# Patient Record
Sex: Male | Born: 1960 | Race: White | Hispanic: No | Smoking: Never smoker
Health system: Southern US, Community
[De-identification: ages and names within clinical notes are randomized; demographics above are authoritative.]

## PROBLEM LIST (undated history)

## (undated) ENCOUNTER — Emergency Department (HOSPITAL_COMMUNITY): Discharge status: Absent without leave | Payer: Self-pay

## (undated) ENCOUNTER — Emergency Department (HOSPITAL_COMMUNITY): Payer: Self-pay | Source: Home / Self Care

## (undated) DIAGNOSIS — I10 Essential (primary) hypertension: Secondary | ICD-10-CM

## (undated) HISTORY — PX: HERNIA REPAIR: SHX51

---

## 1999-02-05 ENCOUNTER — Ambulatory Visit (HOSPITAL_COMMUNITY): Admission: RE | Admit: 1999-02-05 | Discharge: 1999-02-06 | Payer: Self-pay | Admitting: Cardiovascular Disease

## 2014-12-19 ENCOUNTER — Emergency Department
Admission: EM | Admit: 2014-12-19 | Discharge: 2014-12-19 | Disposition: A | Discharge status: Home / Self Care | Payer: BLUE CROSS/BLUE SHIELD | Attending: Emergency Medicine | Admitting: Emergency Medicine

## 2014-12-19 ENCOUNTER — Encounter: Payer: Self-pay | Admitting: Emergency Medicine

## 2014-12-19 ENCOUNTER — Emergency Department: Payer: BLUE CROSS/BLUE SHIELD

## 2014-12-19 DIAGNOSIS — Y9289 Other specified places as the place of occurrence of the external cause: Secondary | ICD-10-CM | POA: Diagnosis not present

## 2014-12-19 DIAGNOSIS — S86811A Strain of other muscle(s) and tendon(s) at lower leg level, right leg, initial encounter: Secondary | ICD-10-CM | POA: Insufficient documentation

## 2014-12-19 DIAGNOSIS — I10 Essential (primary) hypertension: Secondary | ICD-10-CM | POA: Diagnosis not present

## 2014-12-19 DIAGNOSIS — Y9389 Activity, other specified: Secondary | ICD-10-CM | POA: Diagnosis not present

## 2014-12-19 DIAGNOSIS — X501XXA Overexertion from prolonged static or awkward postures, initial encounter: Secondary | ICD-10-CM | POA: Diagnosis not present

## 2014-12-19 DIAGNOSIS — Y998 Other external cause status: Secondary | ICD-10-CM | POA: Insufficient documentation

## 2014-12-19 DIAGNOSIS — Z79899 Other long term (current) drug therapy: Secondary | ICD-10-CM | POA: Insufficient documentation

## 2014-12-19 DIAGNOSIS — S86911A Strain of unspecified muscle(s) and tendon(s) at lower leg level, right leg, initial encounter: Secondary | ICD-10-CM

## 2014-12-19 DIAGNOSIS — Z792 Long term (current) use of antibiotics: Secondary | ICD-10-CM | POA: Diagnosis not present

## 2014-12-19 DIAGNOSIS — S8991XA Unspecified injury of right lower leg, initial encounter: Secondary | ICD-10-CM | POA: Diagnosis present

## 2014-12-19 HISTORY — DX: Essential (primary) hypertension: I10

## 2014-12-19 MED ORDER — HYDROCODONE-ACETAMINOPHEN 5-325 MG PO TABS: 1.0000 | ORAL_TABLET | ORAL | Status: AC | PRN

## 2014-12-19 MED ORDER — MELOXICAM 15 MG PO TABS: 15.0000 mg | ORAL_TABLET | Freq: Every day | ORAL | Status: AC

## 2014-12-19 NOTE — ED Provider Notes (Signed)
[facility] Emergency Department Provider Note  ____________________________________________  Time seen: Approximately 12:39 PM  I have reviewed the triage vital signs and the nursing notes.   HISTORY  Chief Complaint Knee Pain   HPI Aaron Fernandez is a 54 y.o. male presents for evaluation of right knee pain on and off for the past 6 months. 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 Complains of worse pain now than it has been. Currently wearing a knee immobilizer/brace for comfort.   Past Medical History  Diagnosis Date  . Hypertension     There are no active problems to display for this patient.   Past Surgical History  Procedure Laterality Date  . Hernia repair      Current Outpatient Rx  Name  Route  Sig  Dispense  Refill  . AMLODIPINE BESYLATE PO   Oral   Take 10 mg by mouth daily.         . cefdinir (OMNICEF) 300 MG capsule   Oral   Take 300 mg by mouth 2 (two) times daily.         Marland Kitchen triamterene-hydrochlorothiazide (DYAZIDE) 37.5-25 MG capsule   Oral   Take 1 capsule by mouth daily.         Marland Kitchen HYDROcodone-acetaminophen (NORCO) 5-325 MG tablet   Oral   Take 1-2 tablets by mouth every 4 (four) hours as needed for moderate pain.   15 tablet   0   . meloxicam (MOBIC) 15 MG tablet   Oral   Take 1 tablet (15 mg total) by mouth daily.   30 tablet   0     Allergies Review of patient's allergies indicates no known allergies.  History reviewed. No pertinent family history.  Social History Social History  Substance Use Topics  . Smoking status: Never Smoker   . Smokeless tobacco: None  . Alcohol Use: Yes     Comment: rare    Review of Systems Constitutional: No fever/chills Eyes: No visual changes. ENT: No sore throat. Cardiovascular: Denies chest pain. Respiratory: Denies shortness of breath. Gastrointestinal: No  abdominal pain.  No nausea, no vomiting.  No diarrhea.  No constipation. Genitourinary: Negative for dysuria. Musculoskeletal: Positive for right knee pain. Skin: Negative for rash. Neurological: Negative for headaches, focal weakness or numbness.  10-point ROS otherwise negative.  ____________________________________________   PHYSICAL EXAM:  VITAL SIGNS: ED Triage Vitals  Enc Vitals Group     BP 12/19/14 1221 173/110 mmHg     Pulse Rate 12/19/14 1221 93     Resp 12/19/14 1221 20     Temp 12/19/14 1221 98.4 F (36.9 C)     Temp Source 12/19/14 1221 Oral     SpO2 12/19/14 1221 93 %     Weight 12/19/14 1221 305 lb (138.347 kg)     Height 12/19/14 1221 6' (1.829 m)     Head Cir --      Peak Flow --      Pain Score 12/19/14 1221 10     Pain Loc --      Pain Edu? --      Excl. in [place]? --     Constitutional: Alert and oriented. Well appearing and in no acute distress. Cardiovascular: Normal rate, regular rhythm. Grossly normal heart sounds.  Good peripheral circulation. Respiratory: Normal respiratory effort.  No retractions. Lungs CTAB. Gastrointestinal: Soft and nontender. No distention. No abdominal  bruits. No CVA tenderness. Musculoskeletal: No lower extremity tenderness nor edema.  No joint effusions. Neurologic:  Normal speech and language. No gross focal neurologic deficits are appreciated. No gait instability. Skin:  Skin is warm, dry and intact. No rash noted. Psychiatric: Mood and affect are normal. Speech and behavior are normal.  ____________________________________________   LABS (all labs ordered are listed, but only abnormal results are displayed)  Labs Reviewed - No data to display  RADIOLOGY  No acute osseous findings. Negative for fracture dislocation. ____________________________________________   PROCEDURES  Procedure(s) performed: None  Critical Care performed: No  ____________________________________________   INITIAL IMPRESSION /  ASSESSMENT AND PLAN / ED COURSE  Pertinent labs & imaging results that were available during my care of the patient were reviewed by me and considered in my medical decision making (see chart for details).  Acute right knee strain. Encouraged orthopedic follow-up. Continue her knee immobilizer as needed. Rx given for meloxicam 50 mg daily hydrocodone 5/325 for pain. Continue to monitor blood pressure and follow up with PCP clinic for evaluation and possible adjustment of blood pressure medication.  Patient voices no other emergency medical complaints at this time.1 ____________________________________________   FINAL CLINICAL IMPRESSION(S) / ED DIAGNOSES  Final diagnoses:  Knee strain, right, initial encounter      Arlyss Repress, PA-C 12/19/14 [place], MD 12/19/14 1538

## 2014-12-19 NOTE — ED Notes (Signed)
C/o right knee pain for 6 months, has xray scheduled but pain has been worse.  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

## 2014-12-19 NOTE — Discharge Instructions (Signed)

## 2015-01-02 ENCOUNTER — Other Ambulatory Visit: Payer: Self-pay | Admitting: Orthopedic Surgery

## 2015-01-02 DIAGNOSIS — M25561 Pain in right knee: Secondary | ICD-10-CM

## 2015-01-02 DIAGNOSIS — T1590XA Foreign body on external eye, part unspecified, unspecified eye, initial encounter: Secondary | ICD-10-CM

## 2015-01-02 DIAGNOSIS — Z9889 Other specified postprocedural states: Secondary | ICD-10-CM

## 2015-01-02 DIAGNOSIS — Z77018 Contact with and (suspected) exposure to other hazardous metals: Secondary | ICD-10-CM

## 2015-01-24 ENCOUNTER — Ambulatory Visit
Admission: RE | Admit: 2015-01-24 | Discharge: 2015-01-24 | Disposition: A | Discharge status: Home / Self Care | Payer: BLUE CROSS/BLUE SHIELD | Source: Ambulatory Visit | Attending: Orthopedic Surgery | Admitting: Orthopedic Surgery

## 2015-01-24 DIAGNOSIS — S83206A Unspecified tear of unspecified meniscus, current injury, right knee, initial encounter: Secondary | ICD-10-CM | POA: Insufficient documentation

## 2015-01-24 DIAGNOSIS — Z77018 Contact with and (suspected) exposure to other hazardous metals: Secondary | ICD-10-CM

## 2015-01-24 DIAGNOSIS — M25561 Pain in right knee: Secondary | ICD-10-CM | POA: Insufficient documentation

## 2015-01-24 DIAGNOSIS — T1590XA Foreign body on external eye, part unspecified, unspecified eye, initial encounter: Secondary | ICD-10-CM

## 2017-02-24 DIAGNOSIS — C4491 Basal cell carcinoma of skin, unspecified: Secondary | ICD-10-CM

## 2017-02-24 HISTORY — DX: Basal cell carcinoma of skin, unspecified: C44.91

## 2017-02-28 IMAGING — CR DG ORBITS FOR FOREIGN BODY
1 series · 2 of 2 positions shown · non-contrast
Comparison: None.

CLINICAL DATA: Metal working/exposure; clearance prior to MRI

EXAM:
ORBITS FOR FOREIGN BODY - 2 VIEW

[Series 1: dg eye foreign body · 0.14mm/px · 2 of 2 slices shown]
[im 1/2]
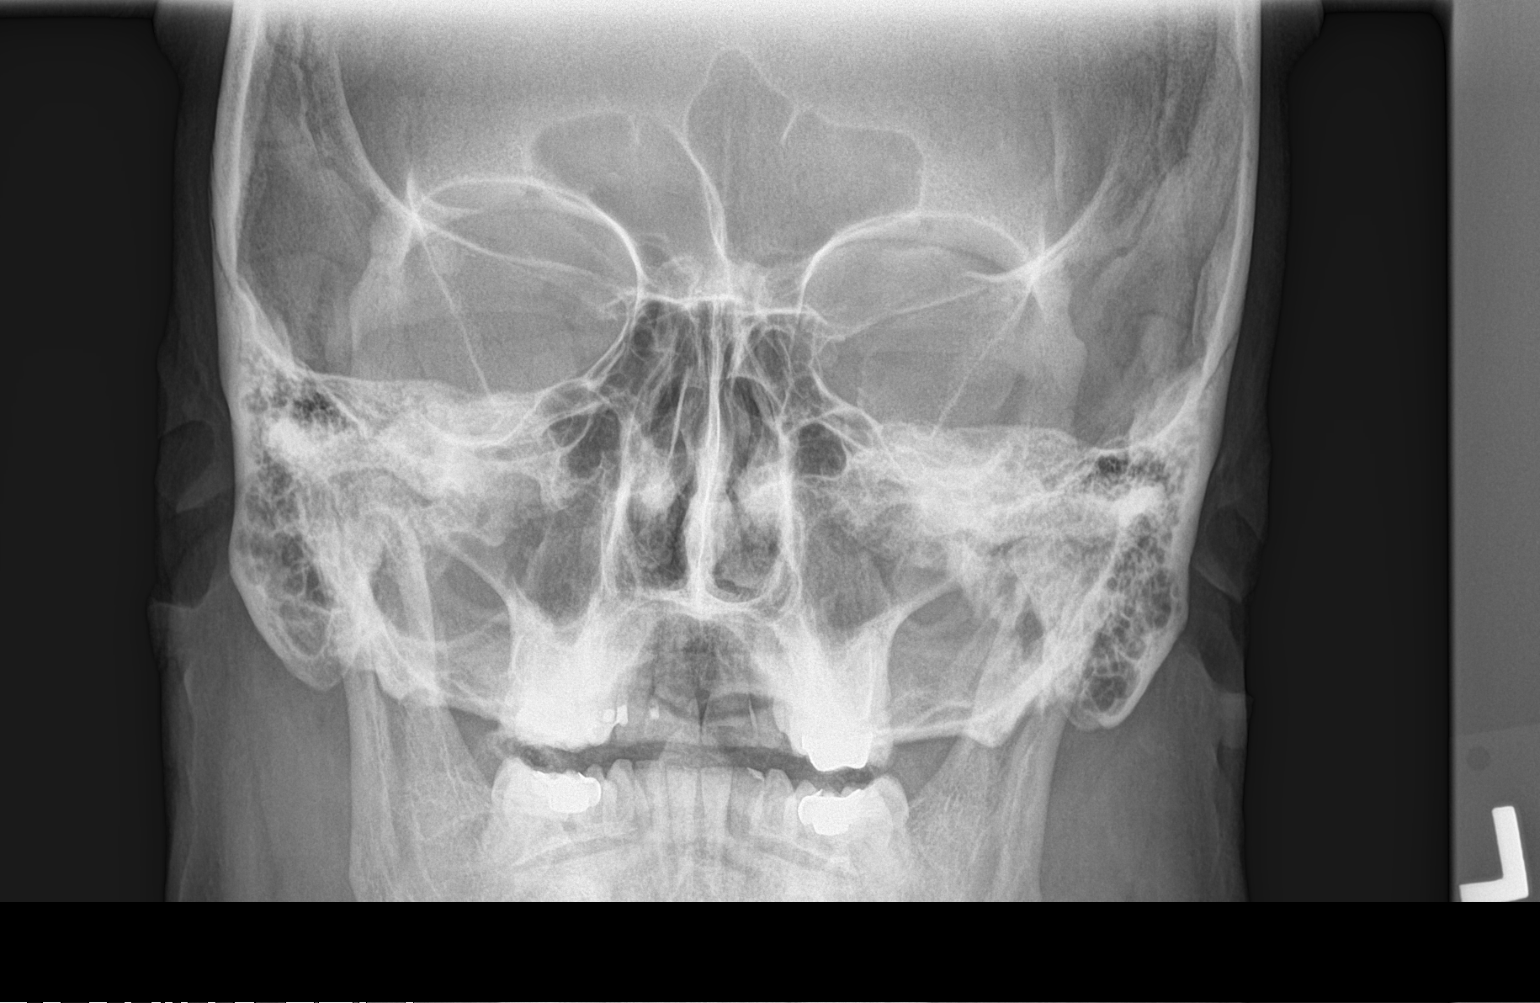
[im 2/2]
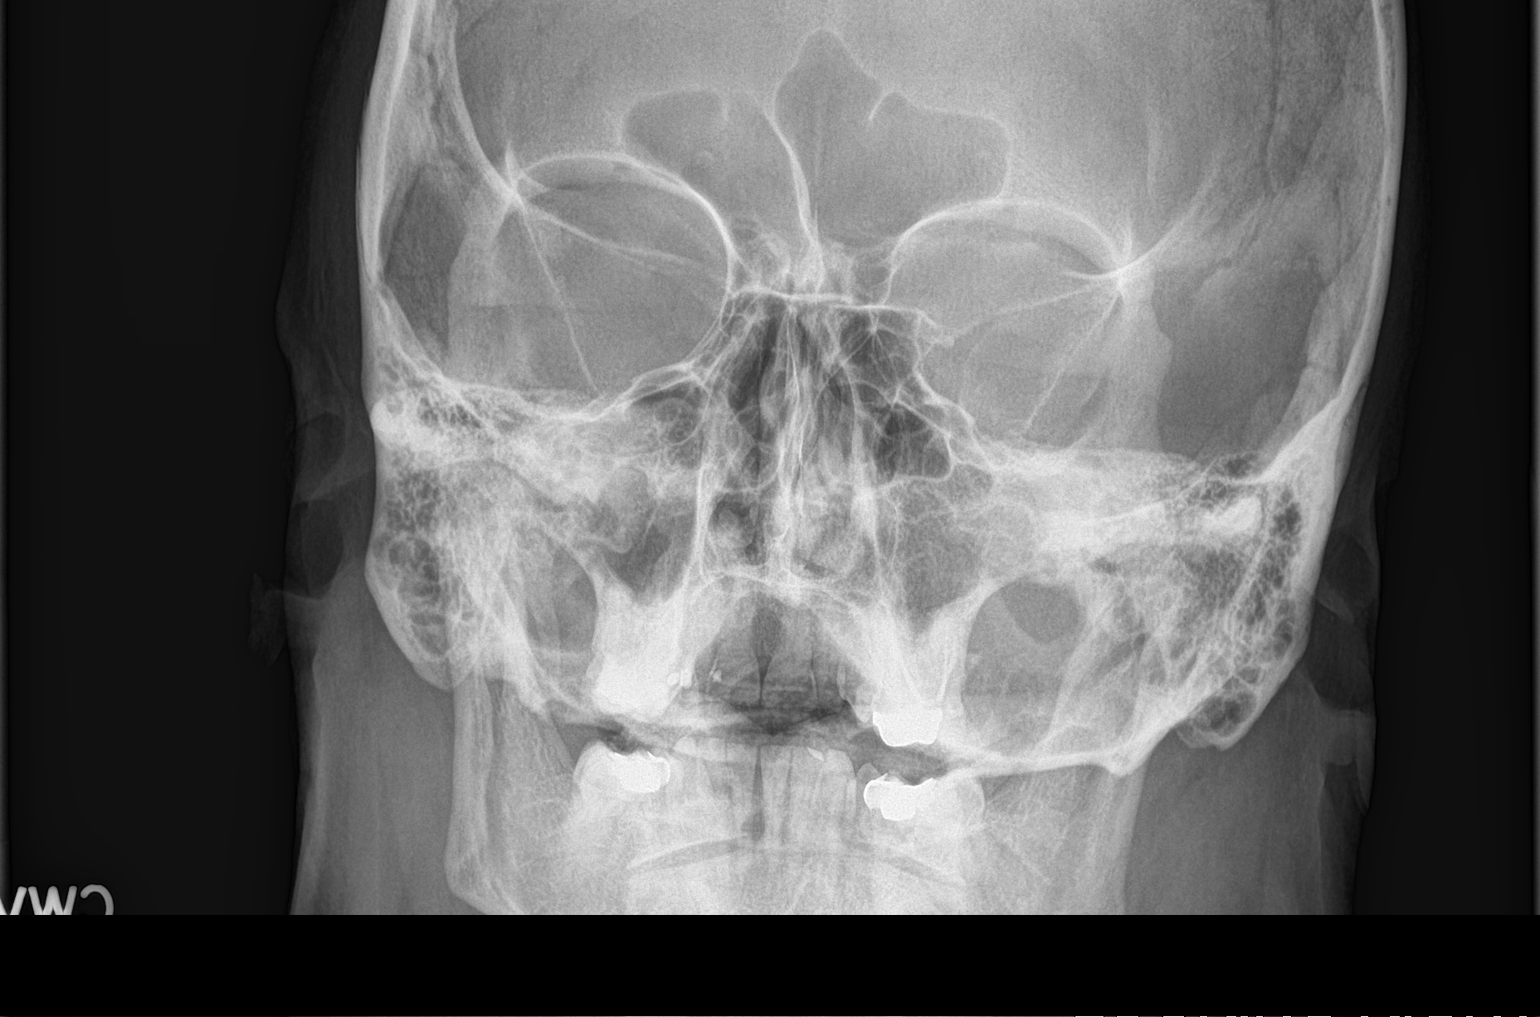

[2 of 2 positions shown; findings below may reference images not displayed]

FINDINGS: There is no evidence of metallic foreign body within the orbits. No
significant bone abnormality identified.
IMPRESSION: No evidence of metallic foreign body within the orbits.

## 2017-02-28 IMAGING — MR MR KNEE*R* W/O CM
5 series · 39 of 40 positions shown · non-contrast
Comparison: None.

CLINICAL DATA: Lifting injury right knee in June 2014. Medial left
knee pain since the incident. Subsequent encounter.

EXAM:
MRI OF THE RIGHT KNEE WITHOUT CONTRAST
TECHNIQUE: Multiplanar, multisequence MR imaging of the knee was performed. No
intravenous contrast was administered.

[Series 3: PD fat-sat · axial · 3.0mm · 0.33mm/px · z∈[-59,+57]mm · 8 of 36 slices shown (1 of 3)]
[im 1/36]
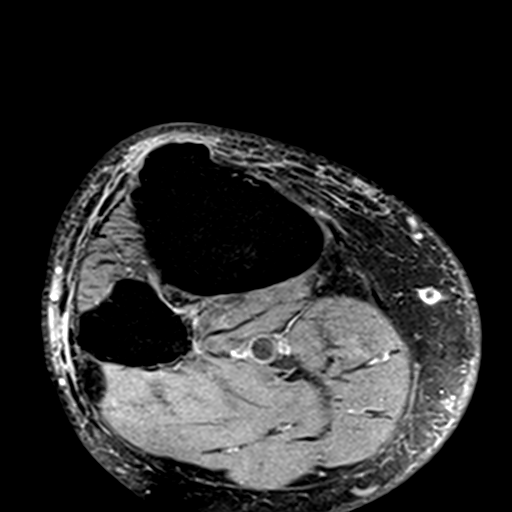
[im 6/36]
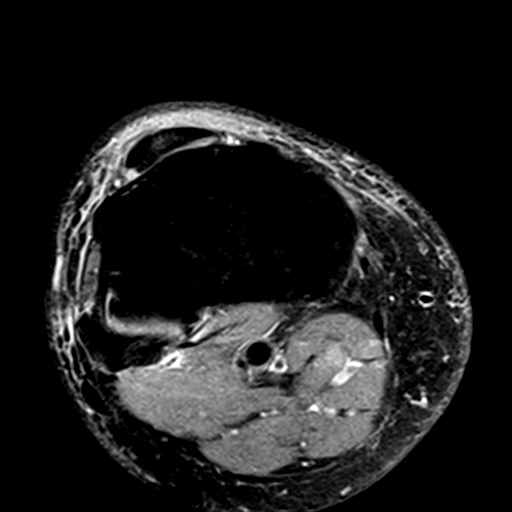
[im 11/36]
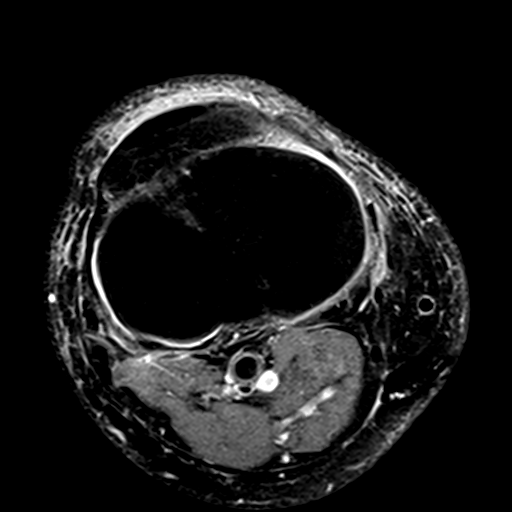
[im 16/36]
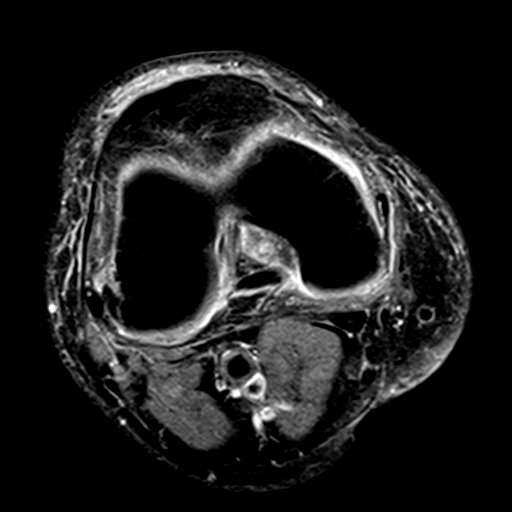
[im 21/36]
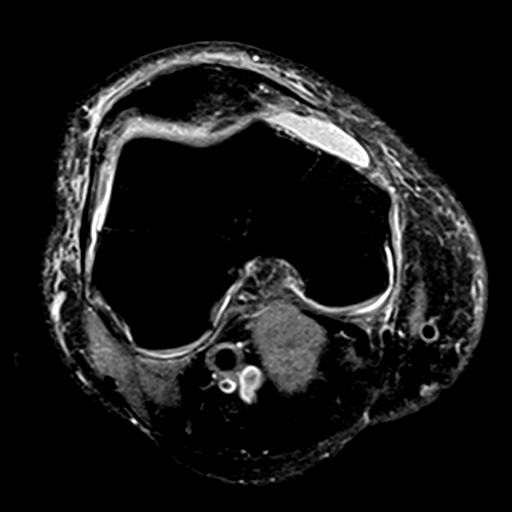
[im 26/36]
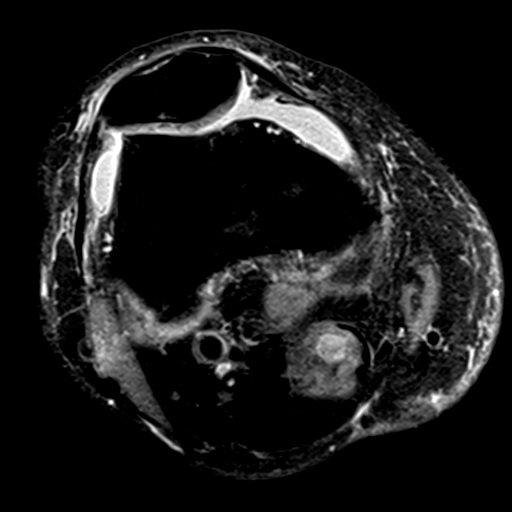
[im 31/36]
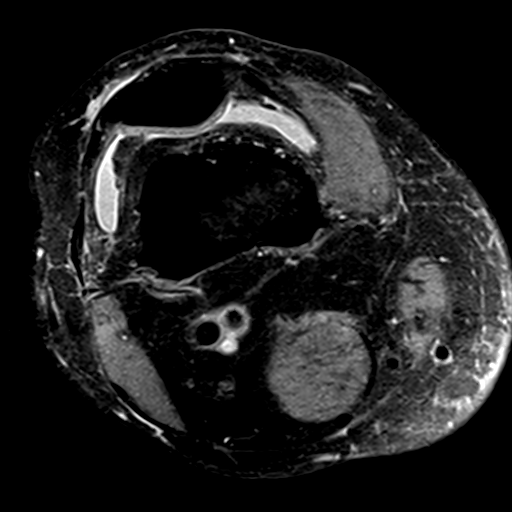
[im 36/36]
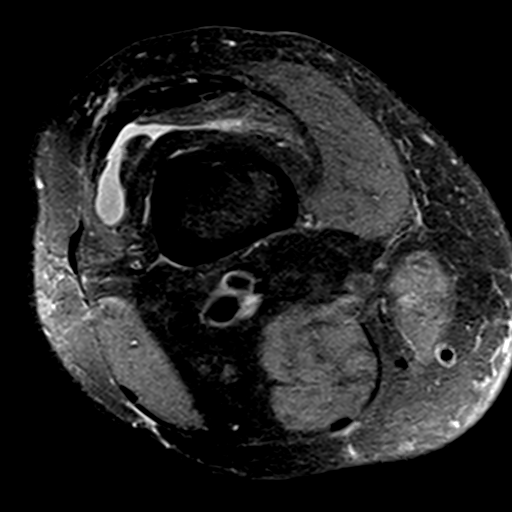

[Series 4: T1 · coronal · 3.0mm · 0.50mm/px · 7 of 35 slices shown]
[im 1/35]
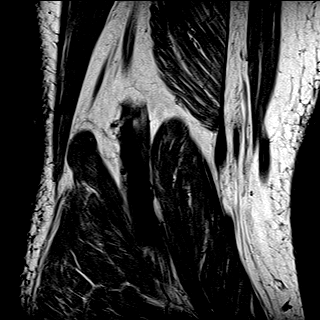
[im 5/35]
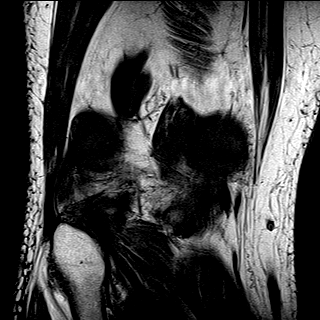
[im 10/35]
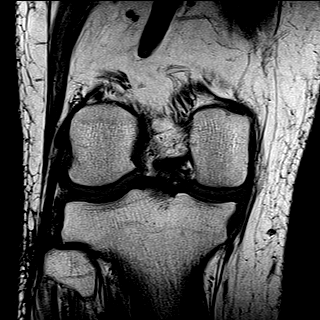
[im 15/35]
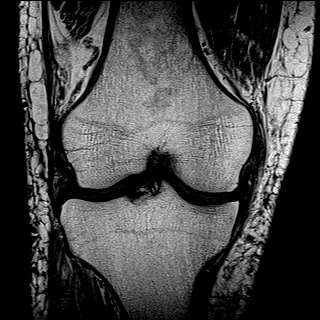
[im 20/35]
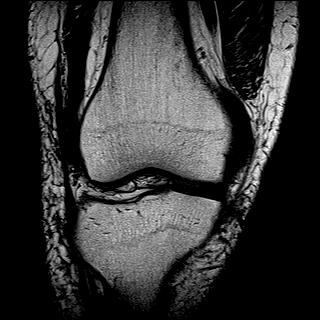
[im 25/35]
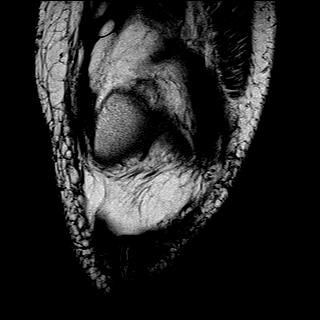
[im 30/35]
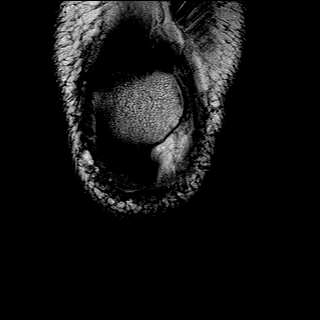

[Series 5: T2 fat-sat · coronal · 3.0mm · 0.50mm/px · 8 of 35 slices shown]
[im 1/35]
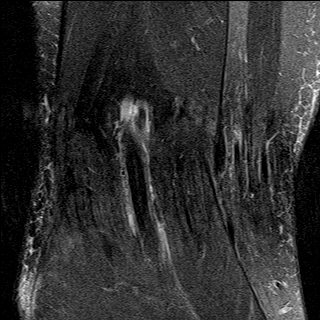
[im 5/35]
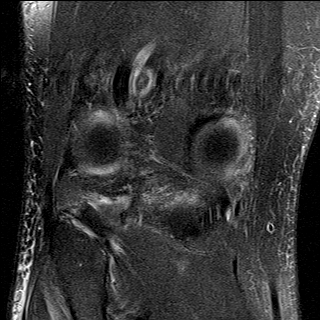
[im 10/35]
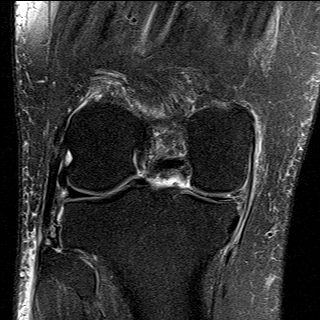
[im 15/35]
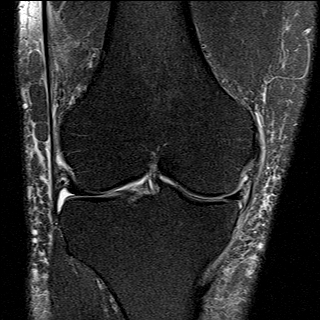
[im 20/35]
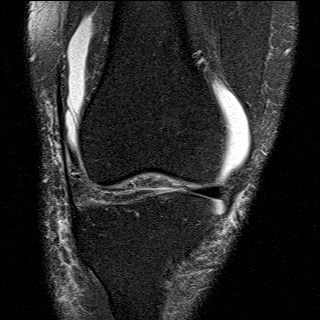
[im 25/35]
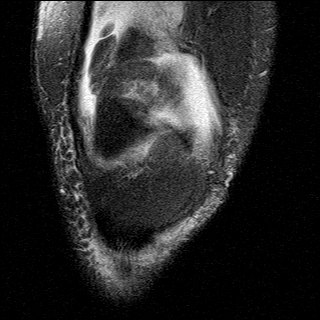
[im 30/35]
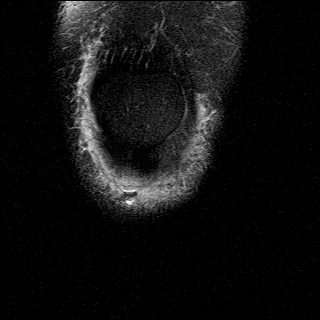
[im 35/35]
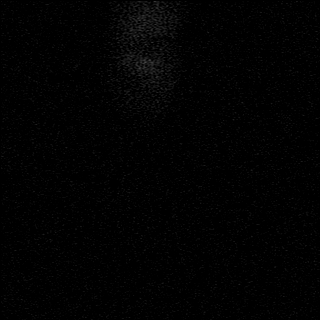

[Series 6: PD fat-sat · sagittal · 3.0mm · 0.66mm/px · 8 of 38 slices shown (2 of 3)]
[im 1/38]
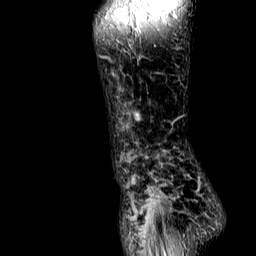
[im 6/38]
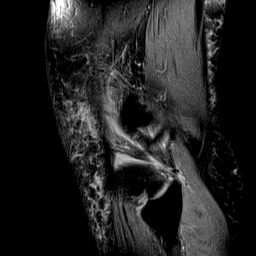
[im 11/38]
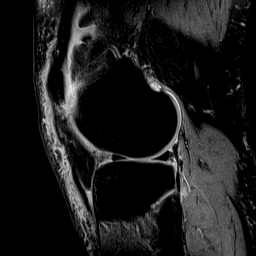
[im 16/38]
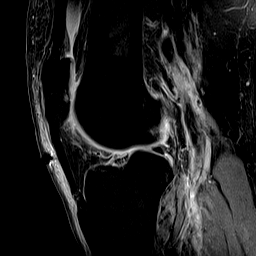
[im 22/38]
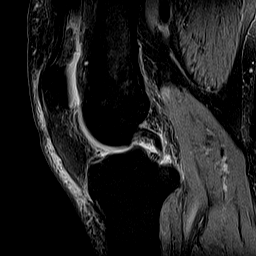
[im 27/38]
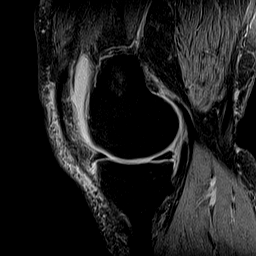
[im 32/38]
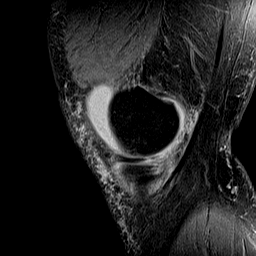
[im 38/38]
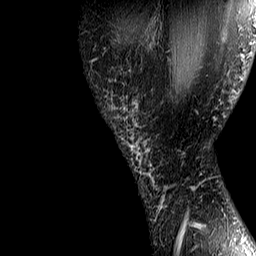

[Series 7: PD fat-sat · coronal · 3.0mm · 0.62mm/px · 8 of 35 slices shown (3 of 3)]
[im 1/35]
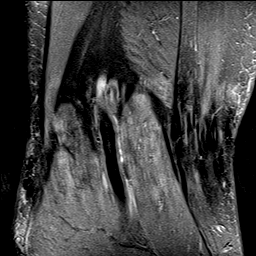
[im 5/35]
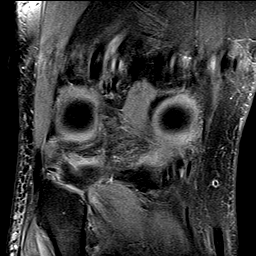
[im 10/35]
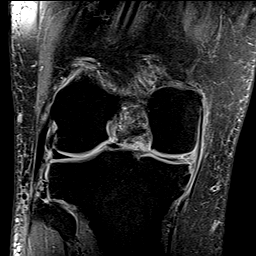
[im 15/35]
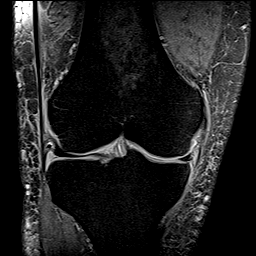
[im 20/35]
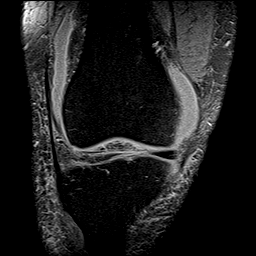
[im 25/35]
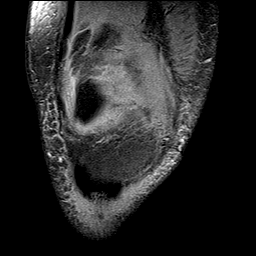
[im 30/35]
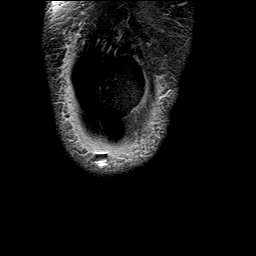
[im 35/35]
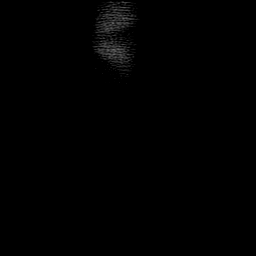

[39 of 40 positions shown; findings below may reference images not displayed]

FINDINGS: MENISCI

Medial meniscus: There is a large horizontal tear reaching the
meniscal undersurface in the periphery of the posterior horn of the
medial meniscus extending to the mid meniscal body. At the junction
the posterior horn and body, the tear has a complex configuration
with both longitudinal and horizontal components. No displaced
fragment.

Lateral meniscus:  Intact.

LIGAMENTS

Cruciates:  Intact.

Collaterals:  Intact.

CARTILAGE

Patellofemoral: Hyaline cartilage loss is most notable along the
lateral patellar facet where tiny subchondral cysts are identified.

Medial:  Unremarkable.

Lateral:  Unremarkable.

Joint:  Small to moderate joint effusion.

Popliteal Fossa:  No Baker's cyst.

Extensor Mechanism:  Intact.

Bones:  Normal marrow signal throughout.
IMPRESSION: Large tear periphery of the posterior horn and posterior aspect of
the body of the medial meniscus without displaced fragment as
described above.

Mild patellofemoral degenerative disease.

## 2019-08-11 ENCOUNTER — Other Ambulatory Visit: Payer: Self-pay

## 2019-08-11 ENCOUNTER — Encounter: Payer: Self-pay | Admitting: Dermatology

## 2019-08-11 ENCOUNTER — Ambulatory Visit (INDEPENDENT_AMBULATORY_CARE_PROVIDER_SITE_OTHER): Payer: BLUE CROSS/BLUE SHIELD | Admitting: Dermatology

## 2019-08-11 DIAGNOSIS — L409 Psoriasis, unspecified: Secondary | ICD-10-CM

## 2019-08-11 DIAGNOSIS — D229 Melanocytic nevi, unspecified: Secondary | ICD-10-CM

## 2019-08-11 DIAGNOSIS — I872 Venous insufficiency (chronic) (peripheral): Secondary | ICD-10-CM

## 2019-08-11 DIAGNOSIS — L304 Erythema intertrigo: Secondary | ICD-10-CM

## 2019-08-11 DIAGNOSIS — L570 Actinic keratosis: Secondary | ICD-10-CM

## 2019-08-11 DIAGNOSIS — D1800 Hemangioma unspecified site: Secondary | ICD-10-CM

## 2019-08-11 DIAGNOSIS — L820 Inflamed seborrheic keratosis: Secondary | ICD-10-CM

## 2019-08-11 DIAGNOSIS — L408 Other psoriasis: Secondary | ICD-10-CM

## 2019-08-11 DIAGNOSIS — L814 Other melanin hyperpigmentation: Secondary | ICD-10-CM

## 2019-08-11 DIAGNOSIS — L578 Other skin changes due to chronic exposure to nonionizing radiation: Secondary | ICD-10-CM

## 2019-08-11 DIAGNOSIS — L817 Pigmented purpuric dermatosis: Secondary | ICD-10-CM

## 2019-08-11 DIAGNOSIS — Z1283 Encounter for screening for malignant neoplasm of skin: Secondary | ICD-10-CM

## 2019-08-11 NOTE — Progress Notes (Signed)
Follow-Up Visit   Subjective  Aaron Fernandez is a 59 y.o. male who presents for the following: TBSE (Patient has a few areas of concern today). The patient presents for Total-Body Skin Exam (TBSE) for skin cancer screening and mole check. Patient presents today for  Annual TBSE, does have a few areas of concern under his breasts, scalp and low back. Patient has a h/o BCC on L. chest and shoulder in 2019. Patient has a family h/o of psoriasis  The following portions of the chart were reviewed this encounter and updated as appropriate:  Allergies  Meds  Problems  Med Hx  Surg Hx  Fam Hx      Review of Systems:  No other skin or systemic complaints except as noted in HPI or Assessment and Plan.  Objective  Well appearing patient in no apparent distress; mood and affect are within normal limits.  A full examination was performed including scalp, head, eyes, ears, nose, lips, neck, chest, axillae, abdomen, back, buttocks, bilateral upper extremities, bilateral lower extremities, hands, feet, fingers, toes, fingernails, and toenails. All findings within normal limits unless otherwise noted below.  Objective  Trunk, extremities: Almost confluent plaques on trunk and extremities    Objective  Chest and Groin: Xerotic hyperpigmentation   Objective  Left Forearm - Anterior, Right Malar Cheek: Erythematous thin papules/macules with gritty scale.   Objective  Bilateral lower legs: irregular patches of orange or brown pigmentation with characteristic "cayenne pepper" spots appearing  Objective  Bilateral lower legs: brown or red skin discoloration around your ankles and lower legs  Objective  Back (14): Erythematous keratotic or waxy stuck-on papule or plaque.    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 Avoid f/g/a Continue mometasone as needed for face Discussed [place] [facility]  and will consider at f/u in 3 months.  Patient would benefit from systemic treatment, but he does not have insurance and systemic treatment may be unaffordable for him at this time.  We may be able to get free medication for him but we would like to get control of his precancerous actinic keratoses before we start more aggressive psoriasis treatment. BSA 40%  Erythema intertrigo Chest and Groin With psoriasis inversa Start Intertrigo cream from [facility]: Iodoquinol: 1% Hydrocortisone: 2.5%, Niacinamide: 2%, Cream  AK (actinic keratosis) (2) Left Forearm - Anterior; Right Malar Cheek Start 5-fluorouracil 5%, and Calcipotriene cream 0.005% mix from [facility]  cream apply twice daily for 10 days to both forearms, top of ears and both temples.  Schamberg's purpura Bilateral lower legs The patient will observe these symptoms, and report promptly any worsening or unexpected persistence.  If well, may return prn.  Venous stasis dermatitis of both lower extremities Bilateral lower legs Recommend graduated compression stockings. The patient will observe these symptoms, and report promptly any worsening or unexpected persistence.  If well, may return prn.  Inflamed seborrheic keratosis (14) Back Cryotherapy today Prior to procedure, discussed risks of blister formation, small wound, skin dyspigmentation, or rare scar following cryotherapy.   Destruction of lesion - Back Complexity: simple   Destruction method: cryotherapy   Informed consent: discussed and consent obtained   Timeout:  patient name, date of birth, surgical site, and procedure verified Lesion destroyed using liquid nitrogen: Yes   Region frozen until ice ball extended beyond lesion: Yes   Outcome: patient tolerated procedure well with no complications   Post-procedure details: wound  care instructions given     Lentigines - Scattered tan macules - Discussed due to sun exposure - Benign, observe - Call for any  changes  Seborrheic Keratoses - Stuck-on, waxy, tan-brown papules and plaques  - Discussed benign etiology and prognosis. - Observe - Call for any changes  Melanocytic Nevi - Tan-brown and/or pink-flesh-colored symmetric macules and papules - Benign appearing on exam today - Observation - Call clinic for new or changing moles - Recommend daily use of broad spectrum spf 30+ sunscreen to sun-exposed areas.   Hemangiomas - Red papules - Discussed benign nature - Observe - Call for any changes  Actinic Damage - diffuse scaly erythematous macules with underlying dyspigmentation - Recommend daily broad spectrum sunscreen SPF 30+ to sun-exposed areas, reapply every 2 hours as needed.  - Call for new or changing lesions.  Skin cancer screening performed today.  Return in about 3 months (around 11/11/2019) for AK Follow up.  Marene Lenz, CMA, am acting as scribe for Sarina Ser, MD . Documentation: I have reviewed the above documentation for accuracy and completeness, and I agree with the above.  Sarina Ser, MD

## 2019-08-11 NOTE — Patient Instructions (Addendum)
Recommend daily broad spectrum sunscreen SPF 30+ to sun-exposed areas, reapply every 2 hours as needed. Call for new or changing lesions.  Prior to procedure, discussed risks of blister formation, small wound, skin dyspigmentation, or rare scar following cryotherapy.  Liquid nitrogen was applied for 10-12 seconds to the skin lesion and the expected blistering or scabbing reaction explained. Do not pick at the area. Patient reminded to expect hypopigmented scars from the procedure. Return if lesion fails to fully resolve.  Cryotherapy Aftercare  . Wash gently with soap and water everyday.   Marland Kitchen Apply Vaseline and Band-Aid daily until healed.    5  F/U cream Apply twice daily for 10 days to tops of both ears, left and right forearms, and left and right temples  Intertrigo cream (for rash) Apply twice daily to affected areas on chest and groin

## 2019-08-14 ENCOUNTER — Encounter: Payer: Self-pay | Admitting: Dermatology

## 2019-10-04 ENCOUNTER — Telehealth: Payer: Self-pay | Admitting: Family

## 2019-10-04 NOTE — Telephone Encounter (Signed)
He has been scheduled for infusion at [facility]. Appreciative of call. Will remove from our infusion clinic list.   Loel Dubonnet, NP

## 2019-10-04 NOTE — Telephone Encounter (Signed)
Called to Discuss with patient about Covid symptoms and the use of the monoclonal antibody infusion for those with mild to moderate Covid symptoms and at a high risk of hospitalization.     Returned call from infusion hotline. Pt appears to qualify for this infusion due to co-morbid conditions (HTN) and/or a member of an at-risk group in accordance with the FDA Emergency Use Authorization.    Unable to reach patient.   Loel Dubonnet, NP

## 2019-11-23 ENCOUNTER — Ambulatory Visit: Payer: Self-pay | Admitting: Dermatology

## 2019-12-28 ENCOUNTER — Other Ambulatory Visit: Payer: Self-pay

## 2019-12-28 ENCOUNTER — Encounter: Payer: Self-pay | Admitting: Emergency Medicine

## 2019-12-28 ENCOUNTER — Emergency Department
Admission: EM | Admit: 2019-12-28 | Discharge: 2019-12-28 | Disposition: A | Discharge status: Home / Self Care | Payer: Self-pay | Attending: Emergency Medicine | Admitting: Emergency Medicine

## 2019-12-28 DIAGNOSIS — Z2914 Encounter for prophylactic rabies immune globin: Secondary | ICD-10-CM | POA: Insufficient documentation

## 2019-12-28 DIAGNOSIS — Z23 Encounter for immunization: Secondary | ICD-10-CM | POA: Insufficient documentation

## 2019-12-28 DIAGNOSIS — Z79899 Other long term (current) drug therapy: Secondary | ICD-10-CM | POA: Insufficient documentation

## 2019-12-28 DIAGNOSIS — Z203 Contact with and (suspected) exposure to rabies: Secondary | ICD-10-CM | POA: Insufficient documentation

## 2019-12-28 DIAGNOSIS — I10 Essential (primary) hypertension: Secondary | ICD-10-CM | POA: Insufficient documentation

## 2019-12-28 MED ORDER — RABIES VACCINE, PCEC IM SUSR
1.0000 mL | Freq: Once | INTRAMUSCULAR | Status: AC
  Administered 2019-12-28: 1 mL via INTRAMUSCULAR
  Filled 2019-12-28: qty 1

## 2019-12-28 MED ORDER — RABIES IMMUNE GLOBULIN 150 UNIT/ML IM INJ
20.0000 [IU]/kg | INJECTION | Freq: Once | INTRAMUSCULAR | Status: AC
  Administered 2019-12-28: 2925 [IU] via INTRAMUSCULAR
  Filled 2019-12-28: qty 19.5

## 2019-12-28 NOTE — Discharge Instructions (Addendum)
Advised to follow-up on day 3, day 7, day 14 to complete series.  Rabies series can be completed at the [facility] urgent care clinic instead of coming to the emergency room.

## 2019-12-28 NOTE — ED Triage Notes (Signed)
Pt to ED via POV with c/o rabies exposure. Pt states was handing a cat who ended up testing positive for rabies. Pt denies known injury from cat at this time. Pt states last exposure to cat was Saturday.

## 2019-12-28 NOTE — ED Provider Notes (Signed)
[name] Provider Note   ____________________________________________   First MD Initiated Contact with Patient 12/28/19 1436     (approximate)  I have reviewed the triage vital signs and the nursing notes.   HISTORY  Chief Complaint Rabies Injection    HPI Aaron Fernandez is a 59 y.o. male patient presents with rib exposure.  Patient that he was around a cat and tested positive for rabies.  Patient states last exposure to the cat was 4 days ago.  Patient denies injury but was told by his doctor's office they need to consider taking the rabies injection.  Patient tetanus shot is up-to-date.         Past Medical History:  Diagnosis Date  . Basal cell carcinoma 02/24/2017   left ant shoulder  . Basal cell carcinoma 03/11/2017   left chest parasternal  . Hypertension     There are no problems to display for this patient.   Past Surgical History:  Procedure Laterality Date  . HERNIA REPAIR      Prior to Admission medications   Medication Sig Start Date End Date Taking? Authorizing Provider  AMLODIPINE BESYLATE PO Take 10 mg by mouth daily.    [provider]  cefdinir (OMNICEF) 300 MG capsule Take 300 mg by mouth 2 (two) times daily.    [provider]  HYDROcodone-acetaminophen (NORCO) 5-325 MG tablet Take 1-2 tablets by mouth every 4 (four) hours as needed for moderate pain. 12/19/14   Beers, Pierce Crane, PA-C  meloxicam (MOBIC) 15 MG tablet Take 1 tablet (15 mg total) by mouth daily. 12/19/14   Beers, Pierce Crane, PA-C  mometasone (ELOCON) 0.1 % cream Apply 1 application topically daily.    [provider]  triamcinolone cream (KENALOG) 0.1 % Apply 1 application topically 2 (two) times daily.    [provider]  triamterene-hydrochlorothiazide (DYAZIDE) 37.5-25 MG capsule Take 1 capsule by mouth daily.    [provider]    Allergies Patient has no known  allergies.  History reviewed. No pertinent family history.  Social History Social History   Tobacco Use  . Smoking status: Never Smoker  Substance Use Topics  . Alcohol use: Yes    Comment: rare  . Drug use: Not on file    Review of Systems Constitutional: No fever/chills Eyes: No visual changes. ENT: No sore throat. Cardiovascular: Denies chest pain. Respiratory: Denies shortness of breath. Gastrointestinal: No abdominal pain.  No nausea, no vomiting.  No diarrhea.  No constipation. Genitourinary: Negative for dysuria. Musculoskeletal: Negative for back pain. Skin: Negative for rash. Neurological: Negative for headaches, focal weakness or numbness. Endocrine:  Hypertension   ____________________________________________   PHYSICAL EXAM:  VITAL SIGNS: ED Triage Vitals  Enc Vitals Group     BP 12/28/19 1347 (!) 159/82     Pulse Rate 12/28/19 1347 89     Resp 12/28/19 1347 20     Temp 12/28/19 1347 98.3 F (36.8 C)     Temp Source 12/28/19 1347 Oral     SpO2 12/28/19 1347 96 %     Weight 12/28/19 1348 (!) 325 lb (147.4 kg)     Height 12/28/19 1348 6' (1.829 m)     Head Circumference --      Peak Flow --      Pain Score 12/28/19 1347 0     Pain Loc --      Pain Edu? --      Excl. in [place]? --  Constitutional: Alert and oriented. Well appearing and in no acute distress. Cardiovascular: Normal rate, regular rhythm. Grossly normal heart sounds.  Good peripheral circulation.  Elevated blood pressure. Respiratory: Normal respiratory effort.  No retractions. Lungs CTAB. Neurologic:  Normal speech and language. No gross focal neurologic deficits are appreciated. No gait instability. Skin:  Skin is warm, dry and intact. No rash noted.  No abrasion or ecchymosis Psychiatric: Mood and affect are normal. Speech and behavior are normal.  ____________________________________________   LABS (all labs ordered are listed, but only abnormal results are displayed)  Labs  Reviewed - No data to display ____________________________________________  EKG   ____________________________________________  RADIOLOGY I, Sable Feil, personally viewed and evaluated these images (plain radiographs) as part of my medical decision making, as well as reviewing the written report by the radiologist.  ED MD interpretation:    Official radiology report(s): No results found.  ____________________________________________   PROCEDURES  Procedure(s) performed (including Critical Care):  Procedures   ____________________________________________   INITIAL IMPRESSION / ASSESSMENT AND PLAN / ED COURSE  As part of my medical decision making, I reviewed the following data within the [place]         Patient presents with close exposure to rabies x4 days ago.  Patient requests rabies shots.  Patient given discharge care instructions advised to follow schedule to complete rabies series.      ____________________________________________   FINAL CLINICAL IMPRESSION(S) / ED DIAGNOSES  Final diagnoses:  Contact with and exposure to rabies     ED Discharge Orders    None      *Please note:  Aaron Fernandez was evaluated in Emergency Department on 12/28/2019 for the symptoms described in the history of present illness. He was evaluated in the context of the global COVID-19 pandemic, which necessitated consideration that the patient might be at risk for infection with the SARS-CoV-2 virus that causes COVID-19. Institutional protocols and algorithms that pertain to the evaluation of patients at risk for COVID-19 are in a state of rapid change based on information released by regulatory bodies including the CDC and federal and state organizations. These policies and algorithms were followed during the patient's care in the ED.  Some ED evaluations and interventions may be delayed as a result of limited staffing during and the  pandemic.*   Note:  This document was prepared using Dragon voice recognition software and may include unintentional dictation errors.    Sable Feil, PA-C 12/28/19 1453    Naaman Plummer, MD 12/28/19 [phone]

## 2019-12-31 ENCOUNTER — Encounter: Payer: Self-pay | Admitting: Emergency Medicine

## 2019-12-31 ENCOUNTER — Ambulatory Visit
Admission: EM | Admit: 2019-12-31 | Discharge: 2019-12-31 | Disposition: A | Discharge status: Home / Self Care | Payer: PRIVATE HEALTH INSURANCE | Attending: Physician Assistant | Admitting: Physician Assistant

## 2019-12-31 DIAGNOSIS — Z203 Contact with and (suspected) exposure to rabies: Secondary | ICD-10-CM

## 2019-12-31 MED ORDER — RABIES VACCINE, PCEC IM SUSR
1.0000 mL | Freq: Once | INTRAMUSCULAR | Status: AC
  Administered 2019-12-31: 1 mL via INTRAMUSCULAR

## 2019-12-31 NOTE — ED Triage Notes (Addendum)
Pt presents to [facility] for day 3 rabies vaccine. Pt states he did well with the other vaccines.

## 2020-01-04 ENCOUNTER — Other Ambulatory Visit: Payer: Self-pay

## 2020-01-04 ENCOUNTER — Ambulatory Visit
Admission: EM | Admit: 2020-01-04 | Discharge: 2020-01-04 | Disposition: A | Discharge status: Home / Self Care | Payer: PRIVATE HEALTH INSURANCE | Attending: Family Medicine | Admitting: Family Medicine

## 2020-01-04 DIAGNOSIS — Z203 Contact with and (suspected) exposure to rabies: Secondary | ICD-10-CM

## 2020-01-04 DIAGNOSIS — Z23 Encounter for immunization: Secondary | ICD-10-CM

## 2020-01-04 MED ORDER — RABIES VACCINE, PCEC IM SUSR
1.0000 mL | Freq: Once | INTRAMUSCULAR | Status: AC
  Administered 2020-01-04: 1 mL via INTRAMUSCULAR

## 2020-01-04 NOTE — ED Triage Notes (Signed)
Patient is here for Day 7 of his rabies vaccine series.  Patient states he has had no other problems with his injections. Injection given in right deltoid.

## 2020-01-04 NOTE — Discharge Instructions (Signed)
Rabies Vaccine: What You Need to Know 1. Why get vaccinated? Rabies vaccine can prevent rabies. Rabies is mainly a disease of animals. Humans get rabies when they are bitten or scratched by infected animals.  Human rabies is rare in the [place]. Wild animals like bats, raccoons, skunks, and foxes are the most common source of human rabies infection in the [place].  Rabies is more common in other parts of the world where dogs still carry rabies. Most rabies deaths in people around the world are caused by bites from unvaccinated dogs. Rabies infects the central nervous system. After infection with rabies, at first there might not be any symptoms. Weeks or even months after a bite, rabies can cause general weakness or discomfort, fever, or headache. As the disease progresses, the person may experience delirium, abnormal behavior, hallucinations, hydrophobia (fear of water), and insomnia. If a person does not receive appropriate medical care after an exposure, human rabies is almost always fatal. Rabies can be prevented by vaccinating pets, staying away from wildlife, and seeking medical care after potential exposures and before symptoms start. 2. Rabies vaccine Rabies vaccine is given to people at high risk of rabies to protect them if they are exposed. People at high risk of exposure to rabies should be offered pre-exposure rabies vaccination, including:  Veterinarians, Insurance account manager, and veterinary students  Rabies laboratory workers  [employer] (people who [place]), and  Persons who work with live vaccine to produce rabies vaccine and rabies immune globulin. Pre-exposure rabies vaccination should also be considered for:  People whose activities bring them into frequent contact with rabies virus or with possibly rabid animals.  International travelers who are likely to come in contact with animals in parts of the world where rabies is common and immediate access to  appropriate care is limited. For pre-exposure protection, 3 doses of rabies vaccine are recommended. People who may be repeatedly exposed to rabies virus should receive periodic testing for immunity, and booster doses might be necessary. Your health care provider can give you more details. Rabies vaccine can prevent rabies if given to a person after they have had an exposure. Anyone who has been bitten by an animal suspected to have rabies, or who otherwise may have been exposed to rabies, should clean the wound and see a health care provider immediately regardless of vaccination status. The health care provider can help determine if the person should receive post-exposure rabies vaccination. For post-exposure protection:  A person who is exposed and has never been vaccinated against rabies should get 4 doses of rabies vaccine. The person should also get another shot called rabies immune globulin (RIG).  A person who has been previously vaccinated should get 2 doses of rabies vaccine and does not need Rabies Immune Globulin. Your health care provider can give you more information. 3. Talk with your health care provider Tell your vaccine provider if the person getting the vaccine:  Has had an allergic reaction after a previous dose of rabies vaccine, or has any severe, life-threatening allergies.  Has a weakened immune system. In some cases, your health care provider may decide to postpone a routine (non-exposure) dose of rabies vaccination to a future visit.  People with minor illnesses, such as a cold, may be vaccinated. People who are moderately or severely ill should usually wait until they recover before getting a routine (non-exposure) dose of rabies vaccine. If you have been exposed to rabies virus, you should get vaccinated regardless of concurrent illnesses,  pregnancy, or breastfeeding. Your health care provider can give you more information. 4. Risks of a vaccine reaction  Soreness,  redness, swelling, or itching at the site of the injection, and headache, nausea, abdominal pain, muscle aches, or dizziness can happen after rabies vaccine.  Hives, pain in the joints, or fever sometimes happen after booster doses.  Very rarely, nervous system disorders such as Guillain-Barr syndrome (GBS) have been reported after rabies vaccine. People sometimes faint after medical procedures, including vaccination. Tell your provider if you feel dizzy or have vision changes or ringing in the ears. As with any medicine, there is a very remote chance of a vaccine causing a severe allergic reaction, other serious injury, or death. 5. What if there is a serious problem? An allergic reaction could occur after the vaccinated person leaves the clinic. If you see signs of a severe allergic reaction (hives, swelling of the face and throat, difficulty breathing, a fast heartbeat, dizziness, or weakness), call 9-1-1 and get the person to the nearest hospital. For other signs that concern you, call your health care provider.  Adverse reactions should be reported to the Vaccine Adverse Event Reporting System (VAERS). Your health care provider will usually file this report, or you can do it yourself. Visit the VAERS website at www.vaers.SamedayNews.es or call 236-435-8534. VAERS is only for reporting reactions, and VAERS staff do not give medical advice. 6. How can I learn more?  Ask your health care provider.  Call your local or state health department.  Contact the Centers for Disease Control and Prevention (CDC): ? Call 223-696-5872 (1-800-CDC-INFO) or ? Visit CDC's rabies website at https://www.moran.com/ Vaccine Information Statement Rabies Vaccine (01/27/2018) This information is not intended to replace advice given to you by your health care provider. Make sure you discuss any questions you have with your health care provider. Document Revised: 04/27/2018 Document Reviewed: 03/10/2018 Elsevier Patient  Education  [place].

## 2020-01-11 ENCOUNTER — Ambulatory Visit
Admission: EM | Admit: 2020-01-11 | Discharge: 2020-01-11 | Disposition: A | Discharge status: Home / Self Care | Payer: PRIVATE HEALTH INSURANCE | Attending: Internal Medicine | Admitting: Internal Medicine

## 2020-01-11 ENCOUNTER — Other Ambulatory Visit: Payer: Self-pay

## 2020-01-11 DIAGNOSIS — Z203 Contact with and (suspected) exposure to rabies: Secondary | ICD-10-CM | POA: Diagnosis not present

## 2020-01-11 DIAGNOSIS — Z23 Encounter for immunization: Secondary | ICD-10-CM

## 2020-01-11 MED ORDER — RABIES VACCINE, PCEC IM SUSR
1.0000 mL | Freq: Once | INTRAMUSCULAR | Status: AC
  Administered 2020-01-11: 1 mL via INTRAMUSCULAR

## 2020-01-11 NOTE — ED Triage Notes (Signed)
Patient here for Day 14 of rabies vaccine series. Patient states he has had no other problems with his injections. Injection given in left deltoid.

## 2022-07-28 ENCOUNTER — Ambulatory Visit (INDEPENDENT_AMBULATORY_CARE_PROVIDER_SITE_OTHER): Payer: PRIVATE HEALTH INSURANCE | Admitting: Dermatology

## 2022-07-28 VITALS — BP 130/76

## 2022-07-28 DIAGNOSIS — R21 Rash and other nonspecific skin eruption: Secondary | ICD-10-CM | POA: Diagnosis not present

## 2022-07-28 MED ORDER — CLOBETASOL PROPIONATE 0.05 % EX CREA: 1.0000 | TOPICAL_CREAM | Freq: Two times a day (BID) | CUTANEOUS | 1 refills | Status: DC

## 2022-07-28 NOTE — Progress Notes (Signed)
   Follow-Up Visit   Subjective  Aaron Fernandez is a 62 y.o. male who presents for the following: check hands, 3.33m,  pt started Rosuvastatin for 4 days and metformin back in April and hands and arms broke out after being in the sun, itchy, using cetaphil cream and mupirocin oint, Vit A,C, E body oil to hands, pt was also given 2 rounds of prednisone, no hx of sun poisoning in past.  Rash still hasn't cleared up after stopping statin med.   The following portions of the chart were reviewed this encounter and updated as appropriate: medications, allergies, medical history  Review of Systems:  No other skin or systemic complaints except as noted in HPI or Assessment and Plan.  Objective  Well appearing patient in no apparent distress; mood and affect are within normal limits.   A focused examination was performed of the following areas: Arms, hands  Relevant exam findings are noted in the Assessment and Plan.    Assessment & Plan    Rash Exam: Erythematous scaly patches forearms, hands and palms with excoriated scale  Chronic and persistent condition with duration greater than 3 months.  Condition is bothersome/symptomatic for patient. Currently flared.   Differential diagnosis:  Photodermatitis due to drug vs PMLE  Treatment Plan: Start Clobetasol cr bid to hands, arms for up to 4 weeks, avoid f/g/a  Start Amlactin Rapid relief, or Cerave SA cream bid Continue photoprotection- long sleeves/gloves when outdoors  Topical steroids (such as triamcinolone, fluocinolone, fluocinonide, mometasone, clobetasol, halobetasol, betamethasone, hydrocortisone) can cause thinning and lightening of the skin if they are used for too long in the same area. Your physician has selected the right strength medicine for your problem and area affected on the body. Please use your medication only as directed by your physician to prevent side effects.     Return in about 4 weeks (around 08/25/2022) for  f/u rash.  I, Ardis Rowan, RMA, am acting as scribe for Willeen Niece, MD .   Documentation: I have reviewed the above documentation for accuracy and completeness, and I agree with the above.  Willeen Niece, MD

## 2022-07-28 NOTE — Patient Instructions (Addendum)
Recommend starting moisturizer with exfoliant (Urea, Salicylic acid, or Lactic acid) one to two times daily to help smooth rough and bumpy skin.  OTC options include Cetaphil Rough and Bumpy lotion (Urea), Eucerin Roughness Relief lotion or spot treatment cream (Urea), CeraVe SA lotion/cream for Rough and Bumpy skin (Sal Acid), Gold Bond Rough and Bumpy cream (Sal Acid), and AmLactin 12% lotion/cream (Lactic Acid).  If applying in morning, also apply sunscreen to sun-exposed areas, since these exfoliating moisturizers can increase sensitivity to sun.   Recommend one of the moisturizers listed above 2 times a day to arms and hands  Due to recent changes in healthcare laws, you may see results of your pathology and/or laboratory studies on MyChart before the doctors have had a chance to review them. We understand that in some cases there may be results that are confusing or concerning to you. Please understand that not all results are received at the same time and often the doctors may need to interpret multiple results in order to provide you with the best plan of care or course of treatment. Therefore, we ask that you please give [place] 2 business days to thoroughly review all your results before contacting the office for clarification. Should we see a critical lab result, you will be contacted sooner.   If You Need Anything After Your Visit  If you have any questions or concerns for your doctor, please call our main line at [phone] and press option 4 to reach your doctor's medical assistant. If no one answers, please leave a voicemail as directed and we will return your call as soon as possible. Messages left after 4 pm will be answered the following business day.   You may also send [place] a message via MyChart. We typically respond to MyChart messages within 1-2 business days.  For prescription refills, please ask your pharmacy to contact our office. Our fax number is [phone].  If you have an  urgent issue when the clinic is closed that cannot wait until the next business day, you can page your doctor at the number below.    Please note that while we do our best to be available for urgent issues outside of office hours, we are not available 24/7.   If you have an urgent issue and are unable to reach [place], you may choose to seek medical care at your doctor's office, retail clinic, urgent care center, or emergency room.  If you have a medical emergency, please immediately call 911 or go to the emergency department.  Pager Numbers  - Dr. Gwen Pounds: [phone]  - Dr. Neale Burly: [phone]  - Dr. Roseanne Reno: [phone]  In the event of inclement weather, please call our main line at [phone] for an update on the status of any delays or closures.  Dermatology Medication Tips: Please keep the boxes that topical medications come in in order to help keep track of the instructions about where and how to use these. Pharmacies typically print the medication instructions only on the boxes and not directly on the medication tubes.   If your medication is too expensive, please contact our office at [phone] option 4 or send [place] a message through MyChart.   We are unable to tell what your co-pay for medications will be in advance as this is different depending on your insurance coverage. However, we may be able to find a substitute medication at lower cost or fill out paperwork to get insurance to cover a needed medication.   If  a prior authorization is required to get your medication covered by your insurance company, please allow [place] 1-2 business days to complete this process.  Drug prices often vary depending on where the prescription is filled and some pharmacies may offer cheaper prices.  The website www.goodrx.com contains coupons for medications through different pharmacies. The prices here do not account for what the cost may be with help from insurance (it may be cheaper with your  insurance), but the website can give you the price if you did not use any insurance.  - You can print the associated coupon and take it with your prescription to the pharmacy.  - You may also stop by our office during regular business hours and pick up a GoodRx coupon card.  - If you need your prescription sent electronically to a different pharmacy, notify our office through [facility] or by phone at [phone] option 4.     Si Usted Necesita Algo Despus de Su Visita  Tambin puede enviarnos un mensaje a travs de Clinical cytogeneticist. Por lo general respondemos a los mensajes de MyChart en el transcurso de 1 a 2 das hbiles.  Para renovar recetas, por favor pida a su farmacia que se ponga en contacto con nuestra oficina. Annie Sable de fax es [place] [phone].  Si tiene un asunto urgente cuando la clnica est cerrada y que no puede esperar hasta el siguiente da hbil, puede llamar/localizar a su doctor(a) al nmero que aparece a continuacin.   Por favor, tenga en cuenta que aunque hacemos todo lo posible para estar disponibles para asuntos urgentes fuera del horario de [place], no estamos disponibles las 24 horas del da, los 7 [address]  [address] de la [place].   Si tiene un problema urgente y no puede comunicarse con nosotros, puede optar por buscar atencin mdica  en el consultorio de su doctor(a), en una clnica privada, en un centro de atencin urgente o en una sala de emergencias.  Si tiene Engineer, drilling, por favor llame inmediatamente al 911 o vaya a la sala de emergencias.  Nmeros de bper  - Dr. Gwen Pounds: [phone]  - Dra. Moye: [phone]  - Dra. Roseanne Reno: [phone]  En caso de inclemencias del [place], por favor llame a Lacy Duverney principal al [phone] para una actualizacin sobre el [place] de cualquier retraso o cierre.  Consejos para la medicacin en dermatologa: Por favor, guarde las cajas en las que vienen los medicamentos de uso tpico para ayudarle a  seguir las instrucciones sobre dnde y cmo usarlos. Las farmacias generalmente imprimen las instrucciones del medicamento slo en las cajas y no directamente en los tubos del [place].   Si su medicamento es muy caro, por favor, pngase en contacto con Rolm [place]ala llamando al [phone] y presione la opcin 4 o envenos un mensaje a travs de Clinical cytogeneticist.   No podemos decirle cul ser su copago por los medicamentos por adelantado ya que esto es diferente dependiendo de la cobertura de su seguro. Sin embargo, es posible que podamos encontrar un medicamento sustituto a Audiological scientist un formulario para que el seguro cubra el medicamento que se considera necesario.   Si se requiere una autorizacin previa para que su compaa de seguros [place] su medicamento, por favor permtanos de 1 a 2 das hbiles para completar [address]  Los precios de los medicamentos varan con frecuencia dependiendo del Environmental consultant de dnde se surte la receta y alguna farmacias pueden ofrecer precios ms baratos.  El sitio web www.goodrx.com tiene  cupones para medicamentos de Health and safety inspector. Los precios aqu no tienen en cuenta lo que podra costar con la ayuda del seguro (puede ser ms barato con su seguro), pero el sitio web puede darle el precio si no utiliz Tourist information centre manager.  - Puede imprimir el cupn correspondiente y llevarlo con su receta a la farmacia.  - Tambin puede pasar por nuestra oficina durante el horario de atencin regular y Education officer, museum una tarjeta de cupones de GoodRx.  - Si necesita que su receta se enve electrnicamente a una farmacia diferente, informe a nuestra oficina a travs de MyChart de [place] o por telfono llamando al [phone] y presione la opcin 4.

## 2022-09-01 ENCOUNTER — Ambulatory Visit (INDEPENDENT_AMBULATORY_CARE_PROVIDER_SITE_OTHER): Payer: PRIVATE HEALTH INSURANCE | Admitting: Dermatology

## 2022-09-01 VITALS — BP 158/86 | HR 89

## 2022-09-01 DIAGNOSIS — L578 Other skin changes due to chronic exposure to nonionizing radiation: Secondary | ICD-10-CM

## 2022-09-01 DIAGNOSIS — L570 Actinic keratosis: Secondary | ICD-10-CM | POA: Diagnosis not present

## 2022-09-01 DIAGNOSIS — L568 Other specified acute skin changes due to ultraviolet radiation: Secondary | ICD-10-CM

## 2022-09-01 DIAGNOSIS — W908XXA Exposure to other nonionizing radiation, initial encounter: Secondary | ICD-10-CM | POA: Diagnosis not present

## 2022-09-01 DIAGNOSIS — R21 Rash and other nonspecific skin eruption: Secondary | ICD-10-CM

## 2022-09-01 MED ORDER — CLOBETASOL PROPIONATE 0.05 % EX CREA: 1.0000 | TOPICAL_CREAM | Freq: Two times a day (BID) | CUTANEOUS | 1 refills | Status: AC

## 2022-09-01 NOTE — Patient Instructions (Addendum)

## 2022-09-01 NOTE — Progress Notes (Signed)
Follow Up Visit   Subjective  Aaron Fernandez is a 62 y.o. male who presents for the following: 4 weeks f/u on a Rash on his arms and hands, started after patient started Rosuvastatin  and metformin back in April and hands and arms broke out after being in the sun, It is doing better, but still not clear.  Using clobetasol and amlactin cream.  He also has persistent scaly spots on arms to check.   The following portions of the chart were reviewed this encounter and updated as appropriate: medications, allergies, medical history  Review of Systems:  No other skin or systemic complaints except as noted in HPI or Assessment and Plan.  Objective  Well appearing patient in no apparent distress; mood and affect are within normal limits.  A focused examination was performed of the following areas:face,hands,arms,fingers   Relevant exam findings are noted in the Assessment and Plan.  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 They are chronic with expected duration over 1 year. A portion of actinic keratoses will progress to squamous cell carcinoma of the skin. It is not possible to reliably predict which spots will progress to skin cancer and so treatment is recommended to prevent development of skin cancer.  Recommend daily broad spectrum sunscreen SPF 30+ to sun-exposed areas, reapply every 2 hours as needed.  Recommend staying in the shade or wearing long sleeves, sun glasses (UVA+UVB protection) and wide brim hats (4-inch brim around the entire circumference of the hat). Call for new or changing lesions.   Destruction of lesion - right forearm x 7, left forearm x 8 (15)  Destruction method: cryotherapy   Informed consent: discussed and consent obtained    Lesion destroyed using liquid nitrogen: Yes   Region frozen until ice ball extended beyond lesion: Yes   Outcome: patient tolerated procedure well with no complications   Post-procedure details: wound care instructions given   Additional details:  Prior to procedure, discussed risks of blister formation, small wound, skin dyspigmentation, or rare scar following cryotherapy. Recommend Vaseline ointment to treated areas while healing.    ACTINIC DAMAGE - chronic, secondary to cumulative UV radiation exposure/sun exposure over time - diffuse scaly erythematous macules with underlying dyspigmentation - Recommend daily broad spectrum sunscreen SPF 30+ to sun-exposed areas, reapply every 2 hours as needed.  - Recommend staying in the shade or wearing long sleeves, sun glasses (UVA+UVB protection) and wide brim hats (4-inch brim around the entire circumference of the hat). - Call for new or changing lesions.   RASH Exam: mild erythema with severe xerosis on the hands, palms, fingers and arms   Chronic and persistent condition with duration greater than 3 months.  Condition is bothersome/symptomatic for patient. Currently improving but not at goal.    Differential diagnosis:  Photodermatitis due to drug vs PMLE   Treatment Plan: Cont Clobetasol cr bid to hands, arms for up to 4 weeks, avoid f/g/a  continue Amlactin Rapid healing cream or Cerave psoriasis moisturizer every day/bid, samples given.  Continue photoprotection- long sleeves/gloves when outdoors   Topical steroids (such as triamcinolone, fluocinolone, fluocinonide, mometasone, clobetasol, halobetasol, betamethasone, hydrocortisone) can cause thinning and lightening of the skin if they are used for too long in the same area. Your physician  has selected the right strength medicine for your problem and area affected on the body. Please use your medication only as directed by your physician to prevent side effects.   Return in about 2  months (around 11/01/2022) for rash.  I, Angelique Holm, CMA, am acting as scribe for Willeen Niece, MD .   Documentation: I have reviewed the above documentation for accuracy and completeness, and I agree with the above.  Willeen Niece, MD

## 2022-11-25 ENCOUNTER — Ambulatory Visit: Payer: PRIVATE HEALTH INSURANCE | Admitting: Dermatology

## 2022-11-25 DIAGNOSIS — D1801 Hemangioma of skin and subcutaneous tissue: Secondary | ICD-10-CM

## 2022-11-25 DIAGNOSIS — L578 Other skin changes due to chronic exposure to nonionizing radiation: Secondary | ICD-10-CM

## 2022-11-25 DIAGNOSIS — L821 Other seborrheic keratosis: Secondary | ICD-10-CM | POA: Diagnosis not present

## 2022-11-25 DIAGNOSIS — R21 Rash and other nonspecific skin eruption: Secondary | ICD-10-CM

## 2022-11-25 DIAGNOSIS — L568 Other specified acute skin changes due to ultraviolet radiation: Secondary | ICD-10-CM

## 2022-11-25 DIAGNOSIS — L570 Actinic keratosis: Secondary | ICD-10-CM | POA: Diagnosis not present

## 2022-11-25 DIAGNOSIS — W098XXA Fall on or from other playground equipment, initial encounter: Secondary | ICD-10-CM

## 2022-11-25 NOTE — Progress Notes (Signed)
Follow Up Visit   Subjective  Aaron Fernandez is a 62 y.o. male who presents for the following: Rash 2 month follow up on rash on hands, palms, and arms. Overall he is much improved. Has been using clobetasol as needed for flares along with over the counter creams for rough itchy skin.   Patient has a few spots he would like checked at posterior neck, right arm, and left chest.    The following portions of the chart were reviewed this encounter and updated as appropriate: medications, allergies, medical history  Review of Systems:  No other skin or systemic complaints except as noted in HPI or Assessment and Plan.  Objective  Well appearing patient in no apparent distress; mood and affect are within normal limits.  A focused examination was performed of the following areas: B/l arms, b/l hands, posterior neck   Relevant exam findings are noted in the Assessment and Plan.  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 - Observe - Call for any changes  HEMANGIOMA Left chest Exam: red papule(s)  Discussed benign nature. Recommend observation. Call for changes.  ACTINIC DAMAGE - chronic, secondary to cumulative UV radiation exposure/sun exposure over time - diffuse scaly erythematous macules with underlying dyspigmentation - Recommend daily broad spectrum sunscreen SPF 30+ to sun-exposed areas, reapply every 2 hours as needed.  - Recommend staying in the shade or wearing long sleeves, sun glasses (UVA+UVB protection) and wide brim hats (4-inch brim around the entire circumference of the hat). - Call for new or changing lesions.   Actinic keratosis (22) right occipital  hairline x 1, right ear helix x 2, left ear helix x 1, right forearm x 10, left forearm x  8  Hypertrophic aks    Actinic keratoses are precancerous spots that appear secondary to cumulative UV radiation exposure/sun exposure over time. They are chronic with expected duration over 1 year. A portion of actinic keratoses will progress to squamous cell carcinoma of the skin. It is not possible to reliably predict which spots will progress to skin cancer and so treatment is recommended to prevent development of skin cancer.  Recommend daily broad spectrum sunscreen SPF 30+ to sun-exposed areas, reapply every 2 hours as needed.  Recommend staying in the shade or wearing long sleeves, sun glasses (UVA+UVB protection) and wide brim hats (4-inch brim around the entire circumference of the hat). Call for new or changing lesions.  Destruction of lesion - right occipital hairline x 1, right ear helix x 2, left ear helix x 1, right forearm x 10, left forearm x  8 (22)  Destruction method: cryotherapy   Informed consent: discussed and consent obtained   Lesion destroyed using liquid nitrogen: Yes   Region frozen until ice ball extended beyond lesion: Yes   Outcome: patient tolerated procedure well with no complications   Post-procedure details: wound care instructions given   Additional details:  Prior to procedure, discussed risks of blister formation, small wound, skin dyspigmentation, or rare scar following cryotherapy. Recommend Vaseline ointment to treated areas while healing.    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 Currently well-controlled.   Treatment Plan: Cont prn for flares Clobetasol cr bid to hands, arms for up to 4 weeks, avoid f/g/a  continue Amlactin Rapid healing cream or Cerave psoriasis moisturizer every day/bid, samples given.  Continue  photoprotection- long sleeves/gloves when outdoors   Topical steroids (such as triamcinolone, fluocinolone, fluocinonide, mometasone, clobetasol, halobetasol, betamethasone, hydrocortisone) can cause thinning and lightening of the skin if they are used for too long in the same area. Your physician has selected the right strength medicine for your problem and area affected on the body. Please use your medication only as directed by your physician to prevent side effects.    Return in about 6 months (around 05/25/2023) for ak follow up.  I, Asher Muir, CMA, am acting as scribe for Willeen Niece, MD.   Documentation: I have reviewed the above documentation for accuracy and completeness, and I agree with the above.  Willeen Niece, MD

## 2022-11-25 NOTE — Patient Instructions (Addendum)
Actinic keratoses are precancerous spots that appear secondary to cumulative UV radiation exposure/sun exposure over time. They are chronic with expected duration over 1 year. A portion of actinic keratoses will progress to squamous cell carcinoma of the skin. It is not possible to reliably predict which spots will progress to skin cancer and so treatment is recommended to prevent development of skin cancer.  Recommend daily broad spectrum sunscreen SPF 30+ to sun-exposed areas, reapply every 2 hours as needed.  Recommend staying in the shade or wearing long sleeves, sun glasses (UVA+UVB protection) and wide brim hats (4-inch brim around the entire circumference of the hat). Call for new or changing lesions.     Cryotherapy Aftercare  Wash gently with soap and water everyday.   Apply Vaseline and Band-Aid daily until healed.     Seborrheic Keratosis  What causes seborrheic keratoses? Seborrheic keratoses are harmless, common skin growths that first appear during adult life.  As time goes by, more growths appear.  Some people may develop a large number of them.  Seborrheic keratoses appear on both covered and uncovered body parts.  They are not caused by sunlight.  The tendency to develop seborrheic keratoses can be inherited.  They vary in color from skin-colored to gray, brown, or even black.  They can be either smooth or have a rough, warty surface.   Seborrheic keratoses are superficial and look as if they were stuck on the skin.  Under the microscope this type of keratosis looks like layers upon layers of skin.  That is why at times the top layer may seem to fall off, but the rest of the growth remains and re-grows.    Treatment Seborrheic keratoses do not need to be treated, but can easily be removed in the office.  Seborrheic keratoses often cause symptoms when they rub on clothing or jewelry.  Lesions can be in the way of shaving.  If they become inflamed, they can cause itching,  soreness, or burning.  Removal of a seborrheic keratosis can be accomplished by freezing, burning, or surgery. If any spot bleeds, scabs, or grows rapidly, please return to have it checked, as these can be an indication of a skin cancer.    Due to recent changes in healthcare laws, you may see results of your pathology and/or laboratory studies on MyChart before the doctors have had a chance to review them. We understand that in some cases there may be results that are confusing or concerning to you. Please understand that not all results are received at the same time and often the doctors may need to interpret multiple results in order to provide you with the best plan of care or course of treatment. Therefore, we ask that you please give [place] 2 business days to thoroughly review all your results before contacting the office for clarification. Should we see a critical lab result, you will be contacted sooner.   If You Need Anything After Your Visit  If you have any questions or concerns for your doctor, please call our main line at [phone] and press option 4 to reach your doctor's medical assistant. If no one answers, please leave a voicemail as directed and we will return your call as soon as possible. Messages left after 4 pm will be answered the following business day.   You may also send [place] a message via MyChart. We typically respond to MyChart messages within 1-2 business days.  For prescription refills, please ask your pharmacy to contact  our office. Our fax number is [phone].  If you have an urgent issue when the clinic is closed that cannot wait until the next business day, you can page your doctor at the number below.    Please note that while we do our best to be available for urgent issues outside of office hours, we are not available 24/7.   If you have an urgent issue and are unable to reach [place], you may choose to seek medical care at your doctor's office, retail clinic,  urgent care center, or emergency room.  If you have a medical emergency, please immediately call 911 or go to the emergency department.  Pager Numbers  - Dr. Gwen Pounds: [phone]  - Dr. Roseanne Reno: [phone]  - Dr. Katrinka Blazing: [phone]   In the event of inclement weather, please call our main line at [phone] for an update on the status of any delays or closures.  Dermatology Medication Tips: Please keep the boxes that topical medications come in in order to help keep track of the instructions about where and how to use these. Pharmacies typically print the medication instructions only on the boxes and not directly on the medication tubes.   If your medication is too expensive, please contact our office at [phone] option 4 or send [place] a message through MyChart.   We are unable to tell what your co-pay for medications will be in advance as this is different depending on your insurance coverage. However, we may be able to find a substitute medication at lower cost or fill out paperwork to get insurance to cover a needed medication.   If a prior authorization is required to get your medication covered by your insurance company, please allow [place] 1-2 business days to complete this process.  Drug prices often vary depending on where the prescription is filled and some pharmacies may offer cheaper prices.  The website www.goodrx.com contains coupons for medications through different pharmacies. The prices here do not account for what the cost may be with help from insurance (it may be cheaper with your insurance), but the website can give you the price if you did not use any insurance.  - You can print the associated coupon and take it with your prescription to the pharmacy.  - You may also stop by our office during regular business hours and pick up a GoodRx coupon card.  - If you need your prescription sent electronically to a different pharmacy, notify our office through [facility] or by phone at [phone] option 4.     Si Usted Necesita Algo Despus de Su Visita  Tambin puede enviarnos un mensaje a travs de Clinical cytogeneticist. Por lo general respondemos a los mensajes de MyChart en el transcurso de 1 a 2 das hbiles.  Para renovar recetas, por favor pida a su farmacia que se ponga en contacto con nuestra oficina. Annie Sable de fax es [place] [phone].  Si tiene un asunto urgente cuando la clnica est cerrada y que no puede esperar hasta el siguiente da hbil, puede llamar/localizar a su doctor(a) al nmero que aparece a continuacin.   Por favor, tenga en cuenta que aunque hacemos todo lo posible para estar disponibles para asuntos urgentes fuera del horario de [place], no estamos disponibles las 24 horas del da, los 7 [address]  [address] de la [place].   Si tiene un problema urgente y no puede comunicarse con nosotros, puede optar por buscar atencin mdica  en el consultorio de su doctor(a), en una clnica  privada, en un centro de atencin urgente o en una sala de emergencias.  Si tiene Engineer, drilling, por favor llame inmediatamente al 911 o vaya a la sala de emergencias.  Nmeros de bper  - Dr. Gwen Pounds: [phone]  - Dra. Roseanne Reno: [phone]  - Dr. Katrinka Blazing: [phone]   En caso de inclemencias del tiempo, por favor llame a Lacy Duverney principal al [phone] para una actualizacin sobre el [place] de cualquier retraso o cierre.  Consejos para la medicacin en dermatologa: Por favor, guarde las cajas en las que vienen los medicamentos de uso tpico para ayudarle a seguir las instrucciones sobre dnde y cmo usarlos. Las farmacias generalmente imprimen las instrucciones del medicamento slo en las cajas y no directamente en los tubos del [place].   Si su medicamento es muy caro, por favor, pngase en contacto con Rolm Gala llamando al [phone] y presione la opcin 4 o envenos un mensaje a travs de Clinical cytogeneticist.   No podemos decirle cul  ser su copago por los medicamentos por adelantado ya que esto es diferente dependiendo de la cobertura de su seguro. Sin embargo, es posible que podamos encontrar un medicamento sustituto a Audiological scientist un formulario para que el seguro cubra el medicamento que se considera necesario.   Si se requiere una autorizacin previa para que su compaa de seguros [place] su medicamento, por favor permtanos de 1 a 2 das hbiles para completar [address]  Los precios de los medicamentos varan con frecuencia dependiendo del Environmental consultant de dnde se surte la receta y alguna farmacias pueden ofrecer precios ms baratos.  El sitio web www.goodrx.com tiene cupones para medicamentos de Health and safety inspector. Los precios aqu no tienen en cuenta lo que podra costar con la ayuda del seguro (puede ser ms barato con su seguro), pero el sitio web puede darle el precio si no utiliz Tourist information centre manager.  - Puede imprimir el cupn correspondiente y llevarlo con su receta a la farmacia.  - Tambin puede pasar por nuestra oficina durante el horario de atencin regular y Education officer, museum una tarjeta de cupones de GoodRx.  - Si necesita que su receta se enve electrnicamente a una farmacia diferente, informe a nuestra oficina a travs de MyChart de [place] o por telfono llamando al [phone] y presione la opcin 4.

## 2023-05-26 ENCOUNTER — Ambulatory Visit (INDEPENDENT_AMBULATORY_CARE_PROVIDER_SITE_OTHER): Payer: PRIVATE HEALTH INSURANCE | Admitting: Dermatology

## 2023-05-26 DIAGNOSIS — L720 Epidermal cyst: Secondary | ICD-10-CM

## 2023-05-26 DIAGNOSIS — L309 Dermatitis, unspecified: Secondary | ICD-10-CM | POA: Diagnosis not present

## 2023-05-26 DIAGNOSIS — D229 Melanocytic nevi, unspecified: Secondary | ICD-10-CM

## 2023-05-26 DIAGNOSIS — L578 Other skin changes due to chronic exposure to nonionizing radiation: Secondary | ICD-10-CM

## 2023-05-26 DIAGNOSIS — L568 Other specified acute skin changes due to ultraviolet radiation: Secondary | ICD-10-CM

## 2023-05-26 DIAGNOSIS — L729 Follicular cyst of the skin and subcutaneous tissue, unspecified: Secondary | ICD-10-CM

## 2023-05-26 DIAGNOSIS — L570 Actinic keratosis: Secondary | ICD-10-CM | POA: Diagnosis not present

## 2023-05-26 DIAGNOSIS — L814 Other melanin hyperpigmentation: Secondary | ICD-10-CM

## 2023-05-26 DIAGNOSIS — W908XXA Exposure to other nonionizing radiation, initial encounter: Secondary | ICD-10-CM

## 2023-05-26 NOTE — Patient Instructions (Addendum)
 For spots at arms, hands, and wrist  Hand Dermatitis is a chronic type of eczema that can come and go on the hands and fingers.  While there is no cure, the rash and symptoms can be managed with topical prescription medications, and for more severe cases, with systemic medications.  Recommend mild soap and routine use of moisturizing cream after handwashing.  Minimize soap/water exposure when possible.    Start clobetasol  cream - apply twice daily to spots at arms  hands and wrists as needed. Can use for up to 2 to 4 weeks.   If resolved can go back to use moisturizer. If comes back can restart clobetasol  again until resolved.   Avoid applying to face, groin, and axilla. Use as directed. Long-term use can cause thinning of the skin.  Topical steroids (such as triamcinolone, fluocinolone, fluocinonide, mometasone, clobetasol , halobetasol, betamethasone, hydrocortisone) can cause thinning and lightening of the skin if they are used for too long in the same area. Your physician has selected the right strength medicine for your problem and area affected on the body. Please use your medication only as directed by your physician to prevent side effects.   Recommend daily broad spectrum sunscreen SPF 30+ to sun-exposed areas, reapply every 2 hours as needed. Call for new or changing lesions.  Staying in the shade or wearing long sleeves, sun glasses (UVA+UVB protection) and wide brim hats (4-inch brim around the entire circumference of the hat) are also recommended for sun protection.     Gentle Skin Care Guide  1. Bathe no more than once a day.  2. Avoid bathing in hot water  3. Use a mild soap like Dove, Vanicream, Cetaphil, CeraVe. Can use Lever 2000 or Cetaphil antibacterial soap  4. Use soap only where you need it. On most days, use it under your arms, between your legs, and on your feet. Let the water rinse other areas unless visibly dirty.  5. When you get out of the bath/shower, use a  towel to gently blot your skin dry, don't rub it.  6. While your skin is still a little damp, apply a moisturizing cream such as Vanicream, CeraVe, Cetaphil, Eucerin, Sarna lotion or plain Vaseline Jelly. For hands apply Neutrogena [place] Hand Cream or Excipial Hand Cream.  7. Reapply moisturizer any time you start to itch or feel dry.  8. Sometimes using free and clear laundry detergents can be helpful. Fabric softener sheets should be avoided. Downy Free & Gentle liquid, or any liquid fabric softener that is free of dyes and perfumes, it acceptable to use  9. If your doctor has given you prescription creams you may apply moisturizers over them     Photodynamic Therapy- "Blue or Red Light Therapy"  Actinic keratoses are the dry, red scaly spots on the skin caused by sun damage. A portion of these spots can turn into skin cancer with time, and treating them can help prevent development of skin cancer.   Treatment of these spots requires removal of the defective skin cells. There are various ways to remove actinic keratoses, including freezing with liquid nitrogen, treatment with creams, or treatment with a blue light procedure in the office.   Photodynamic Therapy (PDT), also known as "blue or red light therapy" is an in office procedure used to treat actinic keratoses. It works by targeting precancerous cells. After treatment, these cells peel off and are replaced by healthy ones.   For your phototherapy appointment, you will have two appointments on  the day of your treatment. The first appointment will be to apply a cream to the treatment area. You will leave this cream on for 1-2 hours depending on the area being treated. The second appointment will be to shine a blue or red light on the area for 16-20 minutes to kill off the precancer cells. It is common to experience a burning sensation during the treatment.  After your treatment, it will be important to keep the treated areas of skin  out of the sun completely for 48-72 hours (2-3 days) to prevent having a reaction.   Common side effects include: - Burning or stinging, which may be severe and can last up to 24-72 hours after your treatment - Scaling and crusting which may last up to 2 weeks - Redness, swelling and/or peeling which can last up to 4 weeks  To Care for Your Skin After PDT/Blue/Red Light Therapy: - Wash with soap, water and shampoo as normal. - If needed, you can use cold compresses (e.g. ice packs) for comfort - If okay with your primary care doctor, you may use analgesics such as acetaminophen  (tylenol ) every 4-6 hours, not to exceed recommended dose - You may apply Cerave Healing Ointment, Vaseline or Aquaphor as needed - If you have a lot of swelling you may take a Benadryl to help with this (this may cause drowsiness), not to exceed recommended dose. This may increase the risk of falls in people over 65 and may slow reaction time while driving, so it is not recommended to take before driving or operating machinery. - Sun Precautions - Wear a wide brim hat for the next week if outside  - Wear a sunblock with zinc or titanium dioxide at least SPF 50 daily  If you have any questions or concerns, please call the office and ask to speak with a nurse.   --------------------------------------------------------------------------------------------------------------     Actinic keratoses are precancerous spots that appear secondary to cumulative UV radiation exposure/sun exposure over time. They are chronic with expected duration over 1 year. A portion of actinic keratoses will progress to squamous cell carcinoma of the skin. It is not possible to reliably predict which spots will progress to skin cancer and so treatment is recommended to prevent development of skin cancer.  Recommend daily broad spectrum sunscreen SPF 30+ to sun-exposed areas, reapply every 2 hours as needed.  Recommend staying in the shade or  wearing long sleeves, sun glasses (UVA+UVB protection) and wide brim hats (4-inch brim around the entire circumference of the hat). Call for new or changing lesions.    Cryotherapy Aftercare  Wash gently with soap and water everyday.   Apply Vaseline and Band-Aid daily until healed.      Due to recent changes in healthcare laws, you may see results of your pathology and/or laboratory studies on MyChart before the doctors have had a chance to review them. We understand that in some cases there may be results that are confusing or concerning to you. Please understand that not all results are received at the same time and often the doctors may need to interpret multiple results in order to provide you with the best plan of care or course of treatment. Therefore, we ask that you please give us  2 business days to thoroughly review all your results before contacting the office for clarification. Should we see a critical lab result, you will be contacted sooner.   If You Need Anything After Your Visit  If you have any  questions or concerns for your doctor, please call our main line at [phone] and press option 4 to reach your doctor's medical assistant. If no one answers, please leave a voicemail as directed and we will return your call as soon as possible. Messages left after 4 pm will be answered the following business day.   You may also send us  a message via MyChart. We typically respond to MyChart messages within 1-2 business days.  For prescription refills, please ask your pharmacy to contact our office. Our fax number is [phone].  If you have an urgent issue when the clinic is closed that cannot wait until the next business day, you can page your doctor at the number below.    Please note that while we do our best to be available for urgent issues outside of office hours, we are not available 24/7.   If you have an urgent issue and are unable to reach us , you may choose to seek  medical care at your doctor's office, retail clinic, urgent care center, or emergency room.  If you have a medical emergency, please immediately call 911 or go to the emergency department.  Pager Numbers  - Dr. Bary Likes: [phone]  - Dr. Annette Barters: [phone]  - Dr. Felipe Horton: [phone]   In the event of inclement weather, please call our main line at [phone] for an update on the status of any delays or closures.  Dermatology Medication Tips: Please keep the boxes that topical medications come in in order to help keep track of the instructions about where and how to use these. Pharmacies typically print the medication instructions only on the boxes and not directly on the medication tubes.   If your medication is too expensive, please contact our office at [phone] option 4 or send us  a message through MyChart.   We are unable to tell what your co-pay for medications will be in advance as this is different depending on your insurance coverage. However, we may be able to find a substitute medication at lower cost or fill out paperwork to get insurance to cover a needed medication.   If a prior authorization is required to get your medication covered by your insurance company, please allow us  1-2 business days to complete this process.  Drug prices often vary depending on where the prescription is filled and some pharmacies may offer cheaper prices.  The website www.goodrx.com contains coupons for medications through different pharmacies. The prices here do not account for what the cost may be with help from insurance (it may be cheaper with your insurance), but the website can give you the price if you did not use any insurance.  - You can print the associated coupon and take it with your prescription to the pharmacy.  - You may also stop by our office during regular business hours and pick up a GoodRx coupon card.  - If you need your prescription sent electronically to a  different pharmacy, notify our office through [facility] or by phone at [phone] option 4.     Si Usted Necesita Algo Despus de Su Visita  Tambin puede enviarnos un mensaje a travs de Clinical cytogeneticist. Por lo general respondemos a los mensajes de MyChart en el transcurso de 1 a 2 das hbiles.  Para renovar recetas, por favor pida a su farmacia que se ponga en contacto con nuestra oficina. Franz Jacks de fax es [place] [phone].  Si tiene un asunto urgente cuando la clnica est cerrada y que  no puede esperar hasta el siguiente da hbil, puede llamar/localizar a su doctor(a) al nmero que aparece a continuacin.   Por favor, tenga en cuenta que aunque hacemos todo lo posible para estar disponibles para asuntos urgentes fuera del horario de [place], no estamos disponibles las 24 horas del da, los 7 [address]  [address] de la [place].   Si tiene un problema urgente y no puede comunicarse con nosotros, puede optar por buscar atencin mdica  en el consultorio de su doctor(a), en una clnica privada, en un centro de atencin urgente o en una sala de emergencias.  Si tiene Engineer, drilling, por favor llame inmediatamente al 911 o vaya a la sala de emergencias.  Nmeros de bper  - Dr. Bary Likes: [phone]  - Dra. Annette Barters: [phone]  - Dr. Felipe Horton: [phone]   En caso de inclemencias del tiempo, por favor llame a Lajuan Pila principal al [phone] para una actualizacin sobre el [place] de cualquier retraso o cierre.  Consejos para la medicacin en dermatologa: Por favor, guarde las cajas en las que vienen los medicamentos de uso tpico para ayudarle a seguir las instrucciones sobre dnde y cmo usarlos. Las farmacias generalmente imprimen las instrucciones del medicamento slo en las cajas y no directamente en los tubos del [place].   Si su medicamento es muy caro, por favor, pngase en contacto con Bettyjane Brunet llamando al [phone] y presione la opcin 4 o envenos un  mensaje a travs de Clinical cytogeneticist.   No podemos decirle cul ser su copago por los medicamentos por adelantado ya que esto es diferente dependiendo de la cobertura de su seguro. Sin embargo, es posible que podamos encontrar un medicamento sustituto a Audiological scientist un formulario para que el seguro cubra el medicamento que se considera necesario.   Si se requiere una autorizacin previa para que su compaa de seguros [place] su medicamento, por favor permtanos de 1 a 2 das hbiles para completar este proceso.  Los precios de los medicamentos varan con frecuencia dependiendo del Environmental consultant de dnde se surte la receta y alguna farmacias pueden ofrecer precios ms baratos.  El sitio web www.goodrx.com tiene cupones para medicamentos de Health and safety inspector. Los precios aqu no tienen en cuenta lo que podra costar con la ayuda del seguro (puede ser ms barato con su seguro), pero el sitio web puede darle el precio si no utiliz Tourist information centre manager.  - Puede imprimir el cupn correspondiente y llevarlo con su receta a la farmacia.  - Tambin puede pasar por nuestra oficina durante el horario de atencin regular y Education officer, museum una tarjeta de cupones de GoodRx.  - Si necesita que su receta se enve electrnicamente a una farmacia diferente, informe a nuestra oficina a travs de MyChart de [place] o por telfono llamando al [phone] y presione la opcin 4.

## 2023-05-26 NOTE — Progress Notes (Signed)
 Follow-Up Visit   Subjective  Aaron Fernandez is a 63 y.o. male who presents for the following: 6 month ak follow up and hand photodermatitis follow up Patient reports still some rough scaly patches and hands and wrist. Did not start clobetasol  cream.   The patient has spots, moles and lesions to be evaluated, some may be new or changing and the patient may have concern these could be cancer.   The following portions of the chart were reviewed this encounter and updated as appropriate: medications, allergies, medical history  Review of Systems:  No other skin or systemic complaints except as noted in HPI or Assessment and Plan.  Objective  Well appearing patient in no apparent distress; mood and affect are within normal limits.  A focused examination was performed of the following areas: Arms, hands, chest, face, scalp, ears   Relevant exam findings are noted in the Assessment and Plan.               right ear helix x 3, left ear helix x 1 (4) Keratotic macules    Assessment & Plan   EPIDERMAL INCLUSION CYST Exam: Subcutaneous nodule at upper back  Benign-appearing. Exam most consistent with an epidermal inclusion cyst. Discussed that a cyst is a benign growth that can grow over time and sometimes get irritated or inflamed. Recommend observation if it is not bothersome. Discussed option of surgical excision to remove it if it is growing, symptomatic, or other changes noted. Please call for new or changing lesions so they can be evaluated.  MELANOCYTIC NEVI Exam: Tan-brown and/or pink-flesh-colored symmetric macules and papules at posterior neck  Treatment Plan: Benign appearing on exam today. Recommend observation. Call clinic for new or changing moles. Recommend daily use of broad spectrum spf 30+ sunscreen to sun-exposed areas.    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 Condition is symptomatic/ bothersome to patient. Not currently at goal.    Hand Dermatitis is a chronic type of eczema that can come and go on the hands and fingers.  While there is no cure, the rash and symptoms can be managed with topical prescription medications, and for more severe cases, with systemic medications.  Recommend mild soap and routine use of moisturizing cream after handwashing.  Minimize soap/water exposure when possible.    Treatment Plan: Restart clobetasol  cream bid to spots at hands, arms, wrist as needed. Use up to 2 - 4 week  Avoid f/g/a   Topical steroids (such as triamcinolone, fluocinolone, fluocinonide, mometasone, clobetasol , halobetasol, betamethasone, hydrocortisone) can cause thinning and lightening of the skin if they are used for too long in the same area. Your physician has selected the right strength medicine for your problem and area affected on the body. Please use your medication only as directed by your physician to prevent side effects.   Continue photoprotection to arms  May consider 5 f/u calcipotriene in fall to arms after rash improved.   Recommend mild soap and moisturizing cream with hand washing.   LENTIGINES Exam: scattered tan macules Due to sun exposure Treatment Plan: Benign-appearing, observe. Recommend daily broad spectrum sunscreen SPF 30+ to sun-exposed areas, reapply every 2 hours as needed.  Call for any changes  ACTINIC DAMAGE WITH PRECANCEROUS ACTINIC KERATOSES Counseling for Topical Chemotherapy Management: Patient exhibits: - Severe, confluent actinic  changes with pre-cancerous actinic keratoses that is secondary to cumulative UV radiation exposure over time - Condition that is severe; chronic, not at goal. - diffuse scaly erythematous macules and papules with underlying dyspigmentation - Discussed Prescription "Field  Treatment" topical Chemotherapy for Severe, Chronic Confluent Actinic Changes with Pre-Cancerous Actinic Keratoses Field treatment involves treatment of an entire area of skin that has confluent Actinic Changes (Sun/ Ultraviolet light damage) and PreCancerous Actinic Keratoses by method of PhotoDynamic Therapy (PDT) and/or prescription Topical Chemotherapy agents such as 5-fluorouracil, 5-fluorouracil/calcipotriene, and/or imiquimod.  The purpose is to decrease the number of clinically evident and subclinical PreCancerous lesions to prevent progression to development of skin cancer by chemically destroying early precancer changes that may or may not be visible.  It has been shown to reduce the risk of developing skin cancer in the treated area. As a result of treatment, redness, scaling, crusting, and open sores may occur during treatment course. One or more than one of these methods may be used and may have to be used several times to control, suppress and eliminate the PreCancerous changes. Discussed treatment course, expected reaction, and possible side effects. - Recommend daily broad spectrum sunscreen SPF 30+ to sun-exposed areas, reapply every 2 hours as needed.  - Staying in the shade or wearing long sleeves, sun glasses (UVA+UVB protection) and wide brim hats (4-inch brim around the entire circumference of the hat) are also recommended. - Call for new or changing lesions. Discussed 5-FU/Vit D to actinic damage at arms and hands. Will plan for fall.  ACTINIC KERATOSIS (4) right ear helix x 3, left ear helix x 1 (4) Actinic keratoses are precancerous spots that appear secondary to cumulative UV radiation exposure/sun exposure over time. They are chronic with expected duration over 1 year. A portion of actinic keratoses will progress to squamous cell carcinoma of the skin. It is not possible to reliably predict which spots will progress to skin cancer and so treatment is recommended to prevent  development of skin cancer.  Recommend daily broad spectrum sunscreen SPF 30+ to sun-exposed areas, reapply every 2 hours as needed.  Recommend staying in the shade or wearing long sleeves, sun glasses (UVA+UVB protection) and wide brim hats (4-inch brim around the entire circumference of the hat). Call for new or changing lesions. Destruction of lesion - right ear helix x 3, left ear helix x 1 (4)  Destruction method: cryotherapy   Informed consent: discussed and consent obtained   Lesion destroyed using liquid nitrogen: Yes   Region frozen until ice ball extended beyond lesion: Yes   Outcome: patient tolerated procedure well with no complications   Post-procedure details: wound care instructions given   Additional details:  Prior to procedure, discussed risks of blister formation, small wound, skin dyspigmentation, or rare scar following cryotherapy. Recommend Vaseline ointment to treated areas while healing.   Return for 3 - 4 month ak / hand derm followup. photodermatitis  I, Randee Busing, CMA, am acting as scribe for Artemio Larry, MD.   Documentation: I have reviewed the above documentation for accuracy and completeness, and I agree with the above.  Artemio Larry, MD

## 2023-09-22 ENCOUNTER — Ambulatory Visit: Payer: PRIVATE HEALTH INSURANCE | Admitting: Dermatology

## 2023-10-02 DIAGNOSIS — Z0000 Encounter for general adult medical examination without abnormal findings: Secondary | ICD-10-CM | POA: Diagnosis not present

## 2023-10-02 DIAGNOSIS — E7800 Pure hypercholesterolemia, unspecified: Secondary | ICD-10-CM | POA: Diagnosis not present

## 2023-10-02 DIAGNOSIS — N400 Enlarged prostate without lower urinary tract symptoms: Secondary | ICD-10-CM | POA: Diagnosis not present

## 2023-10-02 DIAGNOSIS — I10 Essential (primary) hypertension: Secondary | ICD-10-CM | POA: Diagnosis not present

## 2023-10-02 DIAGNOSIS — Z9181 History of falling: Secondary | ICD-10-CM | POA: Diagnosis not present

## 2023-10-02 DIAGNOSIS — Z1331 Encounter for screening for depression: Secondary | ICD-10-CM | POA: Diagnosis not present

## 2023-10-02 DIAGNOSIS — Z125 Encounter for screening for malignant neoplasm of prostate: Secondary | ICD-10-CM | POA: Diagnosis not present

## 2023-10-02 DIAGNOSIS — E119 Type 2 diabetes mellitus without complications: Secondary | ICD-10-CM | POA: Diagnosis not present

## 2023-10-02 DIAGNOSIS — Z1159 Encounter for screening for other viral diseases: Secondary | ICD-10-CM | POA: Diagnosis not present
